# Patient Record
Sex: Female | Born: 1935 | Race: White | Hispanic: No | Smoking: Former smoker
Health system: Southern US, Community
[De-identification: ages and names within clinical notes are randomized; demographics above are authoritative.]

## PROBLEM LIST (undated history)

## (undated) DIAGNOSIS — D638 Anemia in other chronic diseases classified elsewhere: Secondary | ICD-10-CM

## (undated) DIAGNOSIS — J309 Allergic rhinitis, unspecified: Secondary | ICD-10-CM

## (undated) DIAGNOSIS — J150 Pneumonia due to Klebsiella pneumoniae: Secondary | ICD-10-CM

## (undated) DIAGNOSIS — E785 Hyperlipidemia, unspecified: Secondary | ICD-10-CM

## (undated) DIAGNOSIS — R1310 Dysphagia, unspecified: Secondary | ICD-10-CM

## (undated) DIAGNOSIS — G894 Chronic pain syndrome: Secondary | ICD-10-CM

## (undated) DIAGNOSIS — M545 Low back pain, unspecified: Secondary | ICD-10-CM

## (undated) DIAGNOSIS — F418 Other specified anxiety disorders: Secondary | ICD-10-CM

## (undated) DIAGNOSIS — Z9911 Dependence on respirator [ventilator] status: Secondary | ICD-10-CM

## (undated) DIAGNOSIS — B370 Candidal stomatitis: Secondary | ICD-10-CM

## (undated) DIAGNOSIS — E079 Disorder of thyroid, unspecified: Secondary | ICD-10-CM

## (undated) DIAGNOSIS — I480 Paroxysmal atrial fibrillation: Secondary | ICD-10-CM

## (undated) DIAGNOSIS — R739 Hyperglycemia, unspecified: Secondary | ICD-10-CM

## (undated) DIAGNOSIS — G8929 Other chronic pain: Secondary | ICD-10-CM

## (undated) DIAGNOSIS — G7281 Critical illness myopathy: Secondary | ICD-10-CM

## (undated) DIAGNOSIS — I350 Nonrheumatic aortic (valve) stenosis: Secondary | ICD-10-CM

## (undated) DIAGNOSIS — J189 Pneumonia, unspecified organism: Secondary | ICD-10-CM

## (undated) DIAGNOSIS — I10 Essential (primary) hypertension: Secondary | ICD-10-CM

## (undated) DIAGNOSIS — J9601 Acute respiratory failure with hypoxia: Secondary | ICD-10-CM

## (undated) DIAGNOSIS — E870 Hyperosmolality and hypernatremia: Secondary | ICD-10-CM

## (undated) DIAGNOSIS — I519 Heart disease, unspecified: Secondary | ICD-10-CM

## (undated) DIAGNOSIS — I739 Peripheral vascular disease, unspecified: Secondary | ICD-10-CM

## (undated) DIAGNOSIS — J9602 Acute respiratory failure with hypercapnia: Secondary | ICD-10-CM

## (undated) DIAGNOSIS — N179 Acute kidney failure, unspecified: Secondary | ICD-10-CM

## (undated) DIAGNOSIS — E43 Unspecified severe protein-calorie malnutrition: Secondary | ICD-10-CM

## (undated) DIAGNOSIS — J449 Chronic obstructive pulmonary disease, unspecified: Secondary | ICD-10-CM

---

## 2014-03-29 DIAGNOSIS — I350 Nonrheumatic aortic (valve) stenosis: Secondary | ICD-10-CM

## 2014-03-29 DIAGNOSIS — N179 Acute kidney failure, unspecified: Secondary | ICD-10-CM

## 2014-03-29 DIAGNOSIS — I480 Paroxysmal atrial fibrillation: Secondary | ICD-10-CM

## 2014-03-29 HISTORY — PX: AORTIC VALVE REPLACEMENT: SHX41

## 2014-03-29 HISTORY — DX: Acute kidney failure, unspecified: N17.9

## 2014-03-29 HISTORY — DX: Nonrheumatic aortic (valve) stenosis: I35.0

## 2014-03-29 HISTORY — DX: Paroxysmal atrial fibrillation: I48.0

## 2014-03-29 HISTORY — PX: MAZE: SHX5063

## 2014-04-29 HISTORY — PX: TRACHEOSTOMY: SUR1362

## 2014-04-29 HISTORY — PX: PEG PLACEMENT: SHX5437

## 2014-05-29 HISTORY — PX: ESOPHAGOGASTRODUODENOSCOPY: SHX1529

## 2014-07-10 ENCOUNTER — Inpatient Hospital Stay (HOSPITAL_COMMUNITY)
Admission: EM | Admit: 2014-07-10 | Discharge: 2014-07-16 | DRG: 377 | Disposition: A | Discharge status: Other Acute Inpatient Hospital | Payer: Medicare Other | Attending: Internal Medicine | Admitting: Internal Medicine

## 2014-07-10 ENCOUNTER — Encounter (HOSPITAL_COMMUNITY): Payer: Self-pay

## 2014-07-10 ENCOUNTER — Emergency Department (HOSPITAL_COMMUNITY): Payer: Medicare Other

## 2014-07-10 ENCOUNTER — Inpatient Hospital Stay (HOSPITAL_COMMUNITY): Payer: Medicare Other

## 2014-07-10 ENCOUNTER — Encounter (HOSPITAL_COMMUNITY)
Admission: EM | Disposition: A | Discharge status: Nursing Home (Includes ICF) | Payer: Self-pay | Source: Home / Self Care | Attending: Internal Medicine

## 2014-07-10 DIAGNOSIS — E039 Hypothyroidism, unspecified: Secondary | ICD-10-CM | POA: Diagnosis not present

## 2014-07-10 DIAGNOSIS — F329 Major depressive disorder, single episode, unspecified: Secondary | ICD-10-CM | POA: Diagnosis present

## 2014-07-10 DIAGNOSIS — N390 Urinary tract infection, site not specified: Secondary | ICD-10-CM | POA: Diagnosis not present

## 2014-07-10 DIAGNOSIS — I5030 Unspecified diastolic (congestive) heart failure: Secondary | ICD-10-CM | POA: Diagnosis not present

## 2014-07-10 DIAGNOSIS — N189 Chronic kidney disease, unspecified: Secondary | ICD-10-CM | POA: Diagnosis present

## 2014-07-10 DIAGNOSIS — Z952 Presence of prosthetic heart valve: Secondary | ICD-10-CM

## 2014-07-10 DIAGNOSIS — I472 Ventricular tachycardia: Secondary | ICD-10-CM | POA: Diagnosis present

## 2014-07-10 DIAGNOSIS — R1310 Dysphagia, unspecified: Secondary | ICD-10-CM | POA: Diagnosis present

## 2014-07-10 DIAGNOSIS — E785 Hyperlipidemia, unspecified: Secondary | ICD-10-CM | POA: Diagnosis present

## 2014-07-10 DIAGNOSIS — Y95 Nosocomial condition: Secondary | ICD-10-CM | POA: Diagnosis not present

## 2014-07-10 DIAGNOSIS — Z9289 Personal history of other medical treatment: Secondary | ICD-10-CM

## 2014-07-10 DIAGNOSIS — E1165 Type 2 diabetes mellitus with hyperglycemia: Secondary | ICD-10-CM | POA: Diagnosis not present

## 2014-07-10 DIAGNOSIS — I350 Nonrheumatic aortic (valve) stenosis: Secondary | ICD-10-CM | POA: Diagnosis not present

## 2014-07-10 DIAGNOSIS — J189 Pneumonia, unspecified organism: Secondary | ICD-10-CM | POA: Diagnosis not present

## 2014-07-10 DIAGNOSIS — E870 Hyperosmolality and hypernatremia: Secondary | ICD-10-CM | POA: Diagnosis not present

## 2014-07-10 DIAGNOSIS — D638 Anemia in other chronic diseases classified elsewhere: Secondary | ICD-10-CM | POA: Diagnosis present

## 2014-07-10 DIAGNOSIS — Z931 Gastrostomy status: Secondary | ICD-10-CM

## 2014-07-10 DIAGNOSIS — I959 Hypotension, unspecified: Secondary | ICD-10-CM | POA: Diagnosis not present

## 2014-07-10 DIAGNOSIS — D62 Acute posthemorrhagic anemia: Secondary | ICD-10-CM | POA: Diagnosis not present

## 2014-07-10 DIAGNOSIS — R7881 Bacteremia: Secondary | ICD-10-CM | POA: Diagnosis not present

## 2014-07-10 DIAGNOSIS — I739 Peripheral vascular disease, unspecified: Secondary | ICD-10-CM | POA: Diagnosis not present

## 2014-07-10 DIAGNOSIS — Z9911 Dependence on respirator [ventilator] status: Secondary | ICD-10-CM

## 2014-07-10 DIAGNOSIS — I129 Hypertensive chronic kidney disease with stage 1 through stage 4 chronic kidney disease, or unspecified chronic kidney disease: Secondary | ICD-10-CM | POA: Diagnosis present

## 2014-07-10 DIAGNOSIS — K922 Gastrointestinal hemorrhage, unspecified: Secondary | ICD-10-CM

## 2014-07-10 DIAGNOSIS — Z7952 Long term (current) use of systemic steroids: Secondary | ICD-10-CM

## 2014-07-10 DIAGNOSIS — I4581 Long QT syndrome: Secondary | ICD-10-CM | POA: Diagnosis present

## 2014-07-10 DIAGNOSIS — Z880 Allergy status to penicillin: Secondary | ICD-10-CM | POA: Diagnosis not present

## 2014-07-10 DIAGNOSIS — Z794 Long term (current) use of insulin: Secondary | ICD-10-CM

## 2014-07-10 DIAGNOSIS — J9621 Acute and chronic respiratory failure with hypoxia: Secondary | ICD-10-CM

## 2014-07-10 DIAGNOSIS — J449 Chronic obstructive pulmonary disease, unspecified: Secondary | ICD-10-CM | POA: Diagnosis present

## 2014-07-10 DIAGNOSIS — R633 Feeding difficulties, unspecified: Secondary | ICD-10-CM

## 2014-07-10 DIAGNOSIS — K2950 Unspecified chronic gastritis without bleeding: Secondary | ICD-10-CM | POA: Diagnosis not present

## 2014-07-10 DIAGNOSIS — Z930 Tracheostomy status: Secondary | ICD-10-CM | POA: Diagnosis not present

## 2014-07-10 DIAGNOSIS — I480 Paroxysmal atrial fibrillation: Secondary | ICD-10-CM | POA: Diagnosis not present

## 2014-07-10 DIAGNOSIS — Z7982 Long term (current) use of aspirin: Secondary | ICD-10-CM

## 2014-07-10 DIAGNOSIS — G894 Chronic pain syndrome: Secondary | ICD-10-CM | POA: Diagnosis present

## 2014-07-10 DIAGNOSIS — J9620 Acute and chronic respiratory failure, unspecified whether with hypoxia or hypercapnia: Secondary | ICD-10-CM | POA: Diagnosis not present

## 2014-07-10 DIAGNOSIS — K921 Melena: Principal | ICD-10-CM | POA: Diagnosis present

## 2014-07-10 HISTORY — DX: Disorder of thyroid, unspecified: E07.9

## 2014-07-10 HISTORY — DX: Low back pain: M54.5

## 2014-07-10 HISTORY — DX: Chronic pain syndrome: G89.4

## 2014-07-10 HISTORY — DX: Other chronic pain: G89.29

## 2014-07-10 HISTORY — PX: ESOPHAGOGASTRODUODENOSCOPY: SHX5428

## 2014-07-10 HISTORY — DX: Unspecified severe protein-calorie malnutrition: E43

## 2014-07-10 HISTORY — DX: Hyperlipidemia, unspecified: E78.5

## 2014-07-10 HISTORY — DX: Pneumonia, unspecified organism: J18.9

## 2014-07-10 HISTORY — DX: Dysphagia, unspecified: R13.10

## 2014-07-10 HISTORY — DX: Acute respiratory failure with hypoxia: J96.01

## 2014-07-10 HISTORY — DX: Heart disease, unspecified: I51.9

## 2014-07-10 HISTORY — DX: Acute respiratory failure with hypercapnia: J96.02

## 2014-07-10 HISTORY — DX: Candidal stomatitis: B37.0

## 2014-07-10 HISTORY — DX: Chronic obstructive pulmonary disease, unspecified: J44.9

## 2014-07-10 HISTORY — DX: Critical illness myopathy: G72.81

## 2014-07-10 HISTORY — DX: Nonrheumatic aortic (valve) stenosis: I35.0

## 2014-07-10 HISTORY — DX: Anemia in other chronic diseases classified elsewhere: D63.8

## 2014-07-10 HISTORY — DX: Dependence on respirator (ventilator) status: Z99.11

## 2014-07-10 HISTORY — DX: Paroxysmal atrial fibrillation: I48.0

## 2014-07-10 HISTORY — DX: Pneumonia due to Klebsiella pneumoniae: J15.0

## 2014-07-10 HISTORY — DX: Hyperglycemia, unspecified: R73.9

## 2014-07-10 HISTORY — DX: Low back pain, unspecified: M54.50

## 2014-07-10 HISTORY — DX: Peripheral vascular disease, unspecified: I73.9

## 2014-07-10 HISTORY — DX: Hyperosmolality and hypernatremia: E87.0

## 2014-07-10 HISTORY — DX: Essential (primary) hypertension: I10

## 2014-07-10 HISTORY — DX: Allergic rhinitis, unspecified: J30.9

## 2014-07-10 LAB — CBC WITH DIFFERENTIAL/PLATELET
Basophils Absolute: 0 10*3/uL (ref 0.0–0.1)
Basophils Relative: 0 % (ref 0–1)
Eosinophils Absolute: 0 10*3/uL (ref 0.0–0.7)
Eosinophils Relative: 0 % (ref 0–5)
HCT: 25.5 % — ABNORMAL LOW (ref 36.0–46.0)
Hemoglobin: 7.6 g/dL — ABNORMAL LOW (ref 12.0–15.0)
LYMPHS ABS: 0.9 10*3/uL (ref 0.7–4.0)
Lymphocytes Relative: 6 % — ABNORMAL LOW (ref 12–46)
MCH: 29.2 pg (ref 26.0–34.0)
MCHC: 29.8 g/dL — ABNORMAL LOW (ref 30.0–36.0)
MCV: 98.1 fL (ref 78.0–100.0)
Monocytes Absolute: 1.1 10*3/uL — ABNORMAL HIGH (ref 0.1–1.0)
Monocytes Relative: 8 % (ref 3–12)
NEUTROS ABS: 12.3 10*3/uL — AB (ref 1.7–7.7)
NEUTROS PCT: 86 % — AB (ref 43–77)
Platelets: 160 10*3/uL (ref 150–400)
RBC: 2.6 MIL/uL — ABNORMAL LOW (ref 3.87–5.11)
RDW: 18 % — ABNORMAL HIGH (ref 11.5–15.5)
WBC: 14.3 10*3/uL — ABNORMAL HIGH (ref 4.0–10.5)

## 2014-07-10 LAB — GLUCOSE, CAPILLARY
GLUCOSE-CAPILLARY: 53 mg/dL — AB (ref 70–99)
Glucose-Capillary: 169 mg/dL — ABNORMAL HIGH (ref 70–99)
Glucose-Capillary: 45 mg/dL — ABNORMAL LOW (ref 70–99)
Glucose-Capillary: 120 mg/dL — ABNORMAL HIGH (ref 70–99)
Glucose-Capillary: 106 mg/dL — ABNORMAL HIGH (ref 70–99)

## 2014-07-10 LAB — POC OCCULT BLOOD, ED: Fecal Occult Bld: POSITIVE — AB

## 2014-07-10 LAB — URINE MICROSCOPIC-ADD ON

## 2014-07-10 LAB — TROPONIN I
TROPONIN I: 0.06 ng/mL — AB (ref <0.031)
Troponin I: <0.03 ng/mL (ref <0.031)

## 2014-07-10 LAB — I-STAT CHEM 8, ED
BUN: 41 mg/dL — AB (ref 6–23)
CREATININE: 1 mg/dL (ref 0.50–1.10)
Calcium, Ion: 1.25 mmol/L (ref 1.13–1.30)
Chloride: 112 mEq/L (ref 96–112)
Glucose, Bld: 161 mg/dL — ABNORMAL HIGH (ref 70–99)
HCT: 21 % — ABNORMAL LOW (ref 36.0–46.0)
HEMOGLOBIN: 7.1 g/dL — AB (ref 12.0–15.0)
POTASSIUM: 5.1 mmol/L (ref 3.5–5.1)
SODIUM: 148 mmol/L — AB (ref 135–145)
TCO2: 26 mmol/L (ref 0–100)

## 2014-07-10 LAB — BASIC METABOLIC PANEL
ANION GAP: 3 — AB (ref 5–15)
BUN: 35 mg/dL — ABNORMAL HIGH (ref 6–23)
CALCIUM: 8.2 mg/dL — AB (ref 8.4–10.5)
CHLORIDE: 118 meq/L — AB (ref 96–112)
CO2: 29 mmol/L (ref 19–32)
Creatinine, Ser: 1.04 mg/dL (ref 0.50–1.10)
GFR calc Af Amer: 59 mL/min — ABNORMAL LOW (ref >90)
GFR calc non Af Amer: 50 mL/min — ABNORMAL LOW (ref >90)
Glucose, Bld: 113 mg/dL — ABNORMAL HIGH (ref 70–99)
POTASSIUM: 5.4 mmol/L — AB (ref 3.5–5.1)
SODIUM: 150 mmol/L — AB (ref 135–145)

## 2014-07-10 LAB — CBC
HCT: 27.1 % — ABNORMAL LOW (ref 36.0–46.0)
HEMOGLOBIN: 8.7 g/dL — AB (ref 12.0–15.0)
MCH: 29.9 pg (ref 26.0–34.0)
MCHC: 32.1 g/dL (ref 30.0–36.0)
MCV: 93.1 fL (ref 78.0–100.0)
PLATELETS: 151 10*3/uL (ref 150–400)
RBC: 2.91 MIL/uL — AB (ref 3.87–5.11)
RDW: 17.4 % — ABNORMAL HIGH (ref 11.5–15.5)
WBC: 17.1 10*3/uL — AB (ref 4.0–10.5)

## 2014-07-10 LAB — HEPATIC FUNCTION PANEL
ALK PHOS: 79 U/L (ref 39–117)
ALT: 24 U/L (ref 0–35)
AST: 17 U/L (ref 0–37)
Albumin: 2.4 g/dL — ABNORMAL LOW (ref 3.5–5.2)
BILIRUBIN DIRECT: 0.1 mg/dL (ref 0.0–0.3)
Indirect Bilirubin: 0.2 mg/dL — ABNORMAL LOW (ref 0.3–0.9)
Total Bilirubin: 0.3 mg/dL (ref 0.3–1.2)
Total Protein: 5.7 g/dL — ABNORMAL LOW (ref 6.0–8.3)

## 2014-07-10 LAB — URINALYSIS, ROUTINE W REFLEX MICROSCOPIC
BILIRUBIN URINE: NEGATIVE
GLUCOSE, UA: NEGATIVE mg/dL
HGB URINE DIPSTICK: NEGATIVE
Ketones, ur: NEGATIVE mg/dL
Nitrite: NEGATIVE
Protein, ur: NEGATIVE mg/dL
SPECIFIC GRAVITY, URINE: 1.019 (ref 1.005–1.030)
Urobilinogen, UA: 0.2 mg/dL (ref 0.0–1.0)
pH: 5.5 (ref 5.0–8.0)

## 2014-07-10 LAB — PROTIME-INR
INR: 1.02 (ref 0.00–1.49)
Prothrombin Time: 13.5 seconds (ref 11.6–15.2)

## 2014-07-10 LAB — ABO/RH: ABO/RH(D): O POS

## 2014-07-10 LAB — MRSA PCR SCREENING: MRSA by PCR: POSITIVE — AB

## 2014-07-10 LAB — BLOOD PRODUCT ORDER (VERBAL) VERIFICATION

## 2014-07-10 LAB — CBG MONITORING, ED: Glucose-Capillary: 208 mg/dL — ABNORMAL HIGH (ref 70–99)

## 2014-07-10 LAB — I-STAT CG4 LACTIC ACID, ED: Lactic Acid, Venous: 0.81 mmol/L (ref 0.5–2.2)

## 2014-07-10 SURGERY — EGD (ESOPHAGOGASTRODUODENOSCOPY)
Anesthesia: Moderate Sedation

## 2014-07-10 MED ORDER — CHLORHEXIDINE GLUCONATE 0.12 % MT SOLN
15.0000 mL | Freq: Two times a day (BID) | OROMUCOSAL | Status: DC
  Administered 2014-07-10 – 2014-07-16 (×14): 15 mL via OROMUCOSAL
  Filled 2014-07-10 (×14): qty 15

## 2014-07-10 MED ORDER — FENTANYL CITRATE 0.05 MG/ML IJ SOLN
INTRAMUSCULAR | Status: DC | PRN
  Administered 2014-07-10 (×2): 12.5 ug via INTRAVENOUS

## 2014-07-10 MED ORDER — CETYLPYRIDINIUM CHLORIDE 0.05 % MT LIQD
7.0000 mL | Freq: Four times a day (QID) | OROMUCOSAL | Status: DC
  Administered 2014-07-10 – 2014-07-16 (×26): 7 mL via OROMUCOSAL

## 2014-07-10 MED ORDER — DEXTROSE 50 % IV SOLN
INTRAVENOUS | Status: AC
  Filled 2014-07-10: qty 50

## 2014-07-10 MED ORDER — MIDAZOLAM HCL 5 MG/5ML IJ SOLN
INTRAMUSCULAR | Status: DC | PRN
  Administered 2014-07-10: 2 mg via INTRAVENOUS
  Administered 2014-07-10: 1 mg via INTRAVENOUS

## 2014-07-10 MED ORDER — SODIUM CHLORIDE 0.9 % IV SOLN
INTRAVENOUS | Status: DC
  Administered 2014-07-10: 10:00:00 via INTRAVENOUS

## 2014-07-10 MED ORDER — PANTOPRAZOLE SODIUM 40 MG IV SOLR
40.0000 mg | Freq: Two times a day (BID) | INTRAVENOUS | Status: DC
Start: 2014-07-13 — End: 2014-07-11

## 2014-07-10 MED ORDER — VANCOMYCIN HCL IN DEXTROSE 1-5 GM/200ML-% IV SOLN
1000.0000 mg | Freq: Once | INTRAVENOUS | Status: AC
  Administered 2014-07-10: 1000 mg via INTRAVENOUS
  Filled 2014-07-10: qty 200

## 2014-07-10 MED ORDER — SODIUM CHLORIDE 0.9 % IV SOLN
8.0000 mg/h | INTRAVENOUS | Status: DC
  Administered 2014-07-10 – 2014-07-11 (×3): 8 mg/h via INTRAVENOUS
  Filled 2014-07-10 (×6): qty 80

## 2014-07-10 MED ORDER — MUPIROCIN 2 % EX OINT
1.0000 "application " | TOPICAL_OINTMENT | Freq: Two times a day (BID) | CUTANEOUS | Status: AC
  Administered 2014-07-10 – 2014-07-14 (×10): 1 via NASAL
  Filled 2014-07-10 (×2): qty 22

## 2014-07-10 MED ORDER — GLYCOPYRROLATE 0.2 MG/ML IJ SOLN
INTRAMUSCULAR | Status: AC
  Filled 2014-07-10: qty 1

## 2014-07-10 MED ORDER — SODIUM CHLORIDE 0.9 % IV SOLN
80.0000 mg | Freq: Once | INTRAVENOUS | Status: AC
  Administered 2014-07-10: 80 mg via INTRAVENOUS
  Filled 2014-07-10: qty 80

## 2014-07-10 MED ORDER — AMIODARONE HCL 200 MG PO TABS
200.0000 mg | ORAL_TABLET | Freq: Two times a day (BID) | ORAL | Status: DC
  Administered 2014-07-10 – 2014-07-16 (×11): 200 mg
  Filled 2014-07-10 (×13): qty 1

## 2014-07-10 MED ORDER — SODIUM CHLORIDE 0.9 % IV BOLUS (SEPSIS)
1000.0000 mL | Freq: Once | INTRAVENOUS | Status: AC
  Administered 2014-07-10: 1000 mL via INTRAVENOUS

## 2014-07-10 MED ORDER — CHLORHEXIDINE GLUCONATE CLOTH 2 % EX PADS
6.0000 | MEDICATED_PAD | Freq: Every day | CUTANEOUS | Status: DC
  Administered 2014-07-10 – 2014-07-13 (×4): 6 via TOPICAL

## 2014-07-10 MED ORDER — LEVOFLOXACIN IN D5W 750 MG/150ML IV SOLN: 750.0000 mg | INTRAVENOUS | Status: DC

## 2014-07-10 MED ORDER — LEVOFLOXACIN IN D5W 750 MG/150ML IV SOLN
750.0000 mg | Freq: Once | INTRAVENOUS | Status: AC
  Administered 2014-07-10: 750 mg via INTRAVENOUS
  Filled 2014-07-10: qty 150

## 2014-07-10 MED ORDER — SODIUM CHLORIDE 0.9 % IV SOLN: 250.0000 mL | INTRAVENOUS | Status: DC | PRN

## 2014-07-10 MED ORDER — PANTOPRAZOLE SODIUM 40 MG IV SOLR
40.0000 mg | Freq: Once | INTRAVENOUS | Status: AC
  Administered 2014-07-10: 40 mg via INTRAVENOUS
  Filled 2014-07-10: qty 40

## 2014-07-10 MED ORDER — DEXTROSE 50 % IV SOLN
25.0000 mL | Freq: Once | INTRAVENOUS | Status: AC
  Administered 2014-07-10: 25 mL via INTRAVENOUS

## 2014-07-10 MED ORDER — BUTAMBEN-TETRACAINE-BENZOCAINE 2-2-14 % EX AERO
INHALATION_SPRAY | CUTANEOUS | Status: DC | PRN
  Administered 2014-07-10 (×2): 1 via TOPICAL

## 2014-07-10 MED ORDER — INSULIN ASPART 100 UNIT/ML ~~LOC~~ SOLN
2.0000 [IU] | SUBCUTANEOUS | Status: DC
  Administered 2014-07-10: 4 [IU] via SUBCUTANEOUS
  Administered 2014-07-10: 6 [IU] via SUBCUTANEOUS
  Administered 2014-07-11 (×2): 2 [IU] via SUBCUTANEOUS

## 2014-07-10 MED ORDER — VANCOMYCIN HCL IN DEXTROSE 1-5 GM/200ML-% IV SOLN: 1000.0000 mg | INTRAVENOUS | Status: DC

## 2014-07-10 MED ORDER — LEVOTHYROXINE SODIUM 100 MCG IV SOLR
100.0000 ug | Freq: Every day | INTRAVENOUS | Status: DC
  Administered 2014-07-10 – 2014-07-15 (×6): 100 ug via INTRAVENOUS
  Filled 2014-07-10 (×7): qty 5

## 2014-07-10 MED ORDER — SODIUM CHLORIDE 0.9 % IV SOLN: 10.0000 mL/h | Freq: Once | INTRAVENOUS | Status: DC

## 2014-07-10 MED ORDER — SODIUM CHLORIDE 0.9 % IV SOLN
50.0000 ug/h | INTRAVENOUS | Status: DC
  Administered 2014-07-10: 50 ug/h via INTRAVENOUS
  Filled 2014-07-10 (×4): qty 1

## 2014-07-10 MED ORDER — OCTREOTIDE LOAD VIA INFUSION
50.0000 ug | Freq: Once | INTRAVENOUS | Status: AC
  Administered 2014-07-10: 50 ug via INTRAVENOUS
  Filled 2014-07-10: qty 25

## 2014-07-10 MED ORDER — SODIUM CHLORIDE 0.9 % IJ SOLN
10.0000 mL | INTRAMUSCULAR | Status: DC | PRN
  Administered 2014-07-10: 30 mL
  Administered 2014-07-14: 20 mL
  Filled 2014-07-10 (×2): qty 40

## 2014-07-10 MED ORDER — MIDAZOLAM HCL 5 MG/ML IJ SOLN
INTRAMUSCULAR | Status: AC
  Filled 2014-07-10: qty 2

## 2014-07-10 MED ORDER — PROTAMINE SULFATE 10 MG/ML IV SOLN
40.0000 mg | Freq: Once | INTRAVENOUS | Status: AC
  Administered 2014-07-10: 40 mg via INTRAVENOUS
  Filled 2014-07-10: qty 4

## 2014-07-10 MED ORDER — FENTANYL CITRATE 0.05 MG/ML IJ SOLN
INTRAMUSCULAR | Status: AC
  Filled 2014-07-10: qty 2

## 2014-07-10 NOTE — ED Provider Notes (Addendum)
CSN: [id number]     Arrival date & time 07/10/14  0438 History   First MD Initiated Contact with Patient 07/10/14 [phone]     Chief Complaint  Patient presents with  . GI Bleeding    The patient comes from [facility] with GI bleed.  The staff there advised PTAR that she has had two diapers full of bloody stools with clots.     (Consider location/radiation/quality/duration/timing/severity/associated sxs/prior Treatment) Patient is a 80 y.o. female presenting with hematochezia. The history is provided by the EMS personnel and a relative (son). The history is limited by the condition of the patient (tracheostomy).  Rectal Bleeding Quality:  Maroon Amount:  Copious Duration:  8 hours Timing:  Constant Progression:  Unchanged Chronicity:  Recurrent Context: spontaneously   Relieved by:  Nothing Worsened by:  Nothing tried Ineffective treatments:  None tried Associated symptoms: no abdominal pain   Risk factors: anticoagulant use and NSAID use   Risk factors comment:  Aspirin and lovenox   No past medical history on file. No past surgical history on file. No family history on file. History  Substance Use Topics  . Smoking status: Not on file  . Smokeless tobacco: Not on file  . Alcohol Use: Not on file   OB History    No data available     Review of Systems  Unable to perform ROS Gastrointestinal: Positive for hematochezia and anal bleeding. Negative for abdominal pain.      Allergies  Review of patient's allergies indicates not on file.  Home Medications   Prior to Admission medications   Not on File   There were no vitals taken for this visit. Physical Exam  Constitutional: She appears well-developed and well-nourished.  HENT:  Head: Normocephalic and atraumatic.  Tachy mucus membranes  Eyes: EOM are normal. Pupils are equal, round, and reactive to light.  Neck: Normal range of motion. Neck supple.  Tracheostomy in place  Cardiovascular: Normal rate and regular  rhythm.   Pulmonary/Chest: Tachypnea noted. No respiratory distress. She has rhonchi.  Abdominal: Soft. Bowel sounds are normal. There is no tenderness. There is no rebound and no guarding.  Blood in PEG  Genitourinary: Guaiac positive stool.  Musculoskeletal: Normal range of motion. She exhibits no edema.  Neurological: She is alert.  Skin: Skin is warm. She is not diaphoretic. There is pallor.  Psychiatric: She has a normal mood and affect.    ED Course  Procedures (including critical care time) Labs Review Labs Reviewed  POC OCCULT BLOOD, ED - Abnormal; Notable for the following:    Fecal Occult Bld POSITIVE (*)    All other components within normal limits  CBC WITH DIFFERENTIAL  PROTIME-INR  HEPATIC FUNCTION PANEL  URINALYSIS, ROUTINE W REFLEX MICROSCOPIC  I-STAT CHEM 8, ED  I-STAT CG4 LACTIC ACID, ED  TYPE AND SCREEN  TYPE AND SCREEN    Imaging Review Dg Chest Portable 1 View  07/10/2014   CLINICAL DATA:  Acute onset of shortness of breath and weakness. Initial encounter.  EXAM: PORTABLE CHEST - 1 VIEW  COMPARISON:  None.  FINDINGS: The patient's tracheostomy tube is seen ending 5-6 cm above the carina. A left PICC is noted ending about the mid SVC.  Minimal bibasilar opacities likely reflect atelectasis, though mild pneumonia might have a similar appearance. Pulmonary vascularity is at the upper limits of normal. No pleural effusion or pneumothorax is seen.  The cardiomediastinal silhouette is borderline normal in size. The patient is status post median  sternotomy. An aortic valve replacement is noted. No acute osseous abnormalities are identified.  IMPRESSION: Minimal bibasilar opacities likely reflect atelectasis, though mild pneumonia might have a similar appearance.   Electronically Signed   By: Roanna Raider M.D.   On: 07/10/2014 05:14     EKG Interpretation None      MDM   Final diagnoses:  Ventilator dependence  differential diagnosis:  1. UGIB likely  secondary to ulcer on ASA and LOvenox 2. PNA on vent PCN allergic 3. GIB could be diverticular but blood in PEG argues against this 4. UTI as source of fever  Temperature was rectal and was taken prior to administration of any blood products.  Lower following administration of PRBCs   Date: 07/10/2014  Rate: 105  Rhythm: sinus tachycardia  QRS Axis: normal  Intervals: long qtc  ST/T Wave abnormalities: normal  Conduction Disutrbances:none  Narrative Interpretation: long QTC  Old EKG Reviewed: none available    MDM Reviewed: previous chart, nursing note and vitals (from [facility]) Reviewed previous: labs Interpretation: labs, ECG and x-ray Consults: pulmonary and gastrointestinal (case d/w Dr. Arlyce Dice of GI who will see patient in the unit)  Dr. Arlyce Dice or GI states to reverse all anti coagulants. Can stop octreotide unless known liver disease.  Will see patient in the ICU  Actio: Protamine given for Lovenox per pharmacy protocol and platelets 1 six pack for aspirin.    Medications  octreotide (SANDOSTATIN) 2 mcg/mL load via infusion 50 mcg (50 mcg Intravenous Given 07/10/14 0537)    And  octreotide (SANDOSTATIN) 500 mcg in sodium chloride 0.9 % 250 mL (2 mcg/mL) infusion (50 mcg/hr Intravenous New Bag/Given 07/10/14 0533)  vancomycin (VANCOCIN) IVPB 1000 mg/200 mL premix (not administered)  levofloxacin (LEVAQUIN) IVPB 750 mg (not administered)  0.9 %  sodium chloride infusion (not administered)  sodium chloride 0.9 % injection 10-40 mL (30 mLs Intracatheter Given 07/10/14 0624)  pantoprazole (PROTONIX) injection 40 mg (40 mg Intravenous Given 07/10/14 0545)  sodium chloride 0.9 % bolus 1,000 mL (1,000 mLs Intravenous New Bag/Given 07/10/14 0520)  protamine injection 40 mg (40 mg Intravenous Given 07/10/14 0536)  sodium chloride 0.9 % bolus 1,000 mL (1,000 mLs Intravenous New Bag/Given 07/10/14 0554)   Patient had blood cultures draw, given vent and CXR finding will treat for VAP/HCAP  in PCN allergic patient.     1 unit of uncrossmatched blood 1 six pack of platelets due to aspirin.   2 units of crossmatched blood  Admit to ICU  CRITICAL CARE Performed by: Jasmine Awe Total critical care time: 120 minutes Critical care time was exclusive of separately billable procedures and treating other patients. Critical care was necessary to treat or prevent imminent or life-threatening deterioration. Critical care was time spent personally by me on the following activities: development of treatment plan with patient and/or surrogate as well as nursing, discussions with consultants, evaluation of patient's response to treatment, examination of patient, obtaining history from patient or surrogate, ordering and performing treatments and interventions, ordering and review of laboratory studies, ordering and review of radiographic studies, pulse oximetry and re-evaluation of patient's condition.    Jasmine Awe, MD 07/10/14 1610  Barbaraann Avans K Dasia Guerrier-Rasch, MD 07/10/14 [phone]

## 2014-07-10 NOTE — H&P (Signed)
PULMONARY / CRITICAL CARE MEDICINE   Name: Samantha Hubbard MRN: [id number] DOB: March 06, 1935    ADMISSION DATE:  07/10/2014 CONSULTATION DATE:  07/10/2014  REFERRING MD :  ED  CHIEF COMPLAINT:  GI bleed  INITIAL PRESENTATION: 80 year old woman with history of ventilator dependent respiratory failure with trach, dysphagia s/p PEG, diastolic CHF, COPD, chronic anemia, hypothyroidism, HTN, PAF s/p MAZE, aortic stenosis s/p aortic valve replacement, CKD, presenting from [facility] to [facility] ED with GI bleed.  STUDIES:  CXR 1/12 >> minimal bibasilar opacities  SIGNIFICANT EVENTS: 1/12 >> Active GI bleed in ED, GI consulted. Transfusing with 2u pRBC. Received protamine.   HISTORY OF PRESENT ILLNESS:  81 year old woman with history of ventilator dependent respiratory failure with trach, dysphagia s/p PEG, diastolic CHF, COPD, chronic anemia, hypothyroidism, HTN, PAF s/p MAZE, aortic stenosis s/p aortic valve replacement, CKD presenting from [facility] to [facility] ED with GI bleed. Per ED notes, she had two diapers with bloody stools with clots. She denies fevers, chills, CP, SOB, N/V, abdominal pain. Per her son, she had hemetemesis and underwent EGD just before Christmas. She was found to have peptic ulcer that was clipped. No problems since then. She is on ASA  daily and Lovenox  daily.  Admitted 04/02/2014 to [facility] for symptomatic aortic stenosis. Underwent L atrial MAZE procedure for PAF and endarterectomy on 10/6 then underwent AVR. Her postoperative course was complicated by V tach requiring DCCV, right ventricular failure, renal failure requiring HD. She failed extubation and underwent tracheostomy 11/3. She also had chest tubes for pleural effusions during this course that were removed. She was transferred to [facility] 05/14/2014 for vent weaning. She developed HCAP with klebsiella and was tx'd for this. She had a PEG tube placed 05/31/2014.   PAST MEDICAL HISTORY :   has  a past medical history of Acute respiratory failure with hypoxia and hypercapnia; Ventilator dependent; Healthcare-associated pneumonia; Pneumonia, Klebsiella; Left ventricular diastolic dysfunction; Chronic pain syndrome; Critical illness myopathy; Severe protein-calorie malnutrition; Dysphagia; COPD (chronic obstructive pulmonary disease); Anemia of chronic disease; Thyroid disease; Depression; Anxiety; Hypertension; Thrush; Hypernatremia; Paroxysmal a-fib (10/15); Aortic stenosis (10/15); Electrolyte imbalance; Renal disorder; History of hemodialysis; Hyperglycemia; Encounter for wound care; Insomnia; Peripheral vascular disease; Generalized weakness; Chronic low back pain; Emphysema of lung; Hyperlipemia; and Allergic rhinitis.  has no past surgical history on file. Prior to Admission medications   Medication Sig Start Date End Date Taking? Authorizing Provider  acetaminophen (TYLENOL) 650 MG CR tablet 650 mg by PEG Tube route every 6 (six) hours as needed for pain or fever.   Yes Historical Provider, MD  acetaminophen (TYLENOL) 650 MG suppository Place 650 mg rectally every 6 (six) hours as needed for fever.   Yes Historical Provider, MD  ALPRAZolam (XANAX) 0.25 MG tablet 0.25 mg by PEG Tube route 2 (two) times daily.   Yes Historical Provider, MD  amiodarone (PACERONE) 200 MG tablet 200 mg by PEG Tube route 2 (two) times daily.   Yes Historical Provider, MD  aspirin EC 81 MG tablet 81 mg by PEG Tube route daily.   Yes Historical Provider, MD  atorvastatin (LIPITOR) 40 MG tablet 40 mg by PEG Tube route daily.   Yes Historical Provider, MD  bismuth subsalicylate (PEPTO BISMOL) 262 MG chewable tablet 524 mg by PEG Tube route at bedtime.   Yes Historical Provider, MD  budesonide (PULMICORT) 0.5 MG/2ML nebulizer solution 0.5 mg by Tracheal Tube route every 12 (twelve) hours.   Yes Historical Provider, MD  carvedilol (COREG) 3.125 MG tablet 3.125 mg by PEG Tube route 2 (two) times daily with a meal.    Yes Historical Provider, MD  chlorhexidine (PERIDEX) 0.12 % solution 15 mLs by PEG Tube route every 12 (twelve) hours.   Yes Historical Provider, MD  Cholecalciferol (VITAMIN D3) 2000 UNITS TABS 2,000 Units by PEG Tube route daily.   Yes Historical Provider, MD  enoxaparin (LOVENOX) 40 MG/0.4ML injection Inject 40 mg into the skin daily.   Yes Historical Provider, MD  guaifenesin (HUMIBID E) 400 MG TABS tablet 400 mg by PEG Tube route 2 (two) times daily.   Yes Historical Provider, MD  insulin aspart (NOVOLOG FLEXPEN) 100 UNIT/ML FlexPen Inject 1-12 Units into the skin every 6 (six) hours. 111-200=1 units 151-200=2 units 201-250=4 units 251-300=6 units 301-350=8 units 351-400=10 units 401-450=12 units or greater   Yes Historical Provider, MD  ipratropium-albuterol (DUONEB) 0.5-2.5 (3) MG/3ML SOLN Take 3 mLs by nebulization every 6 (six) hours.   Yes Historical Provider, MD  Lactobacillus Rhamnosus, GG, (CULTURELLE PO) 1 capsule by PEG Tube route 2 (two) times daily.   Yes Historical Provider, MD  lansoprazole (PREVACID SOLUTAB) 30 MG disintegrating tablet 30 mg by PEG Tube route 2 (two) times daily.   Yes Historical Provider, MD  levothyroxine (SYNTHROID, LEVOTHROID) 200 MCG tablet Take 200 mcg by mouth daily before breakfast.   Yes Historical Provider, MD  Nutritional Supplements (NUTREN 2.0 PO) 240 mLs by Pump Prime route every 6 (six) hours. 4740ml/hr   Yes Historical Provider, MD  ondansetron (ZOFRAN) 4 MG tablet 4 mg by PEG Tube route every 6 (six) hours as needed for nausea or vomiting.   Yes Historical Provider, MD  ondansetron (ZOFRAN) 4 MG/5ML solution Place 4 mg into feeding tube every 6 (six) hours as needed for nausea or vomiting.   Yes Historical Provider, MD  oxyCODONE (OXY IR/ROXICODONE) 5 MG immediate release tablet 5 mg by PEG Tube route every 6 (six) hours as needed for moderate pain or severe pain.   Yes Historical Provider, MD  predniSONE (DELTASONE) 5 MG tablet Take 5 mg by  mouth daily with breakfast.   Yes Historical Provider, MD  PROTEIN PO Give 74 mLs by tube daily.   Yes Historical Provider, MD  sertraline (ZOLOFT) 100 MG tablet 100 mg by PEG Tube route daily.   Yes Historical Provider, MD   Allergies  Allergen Reactions  . Augmentin [Amoxicillin-Pot Clavulanate] Other (See Comments)    On MAR  . Bactrim [Sulfamethoxazole-Trimethoprim] Other (See Comments)    ON  MAR  . Neo-Synephrine 12 Hour Spray [Nasal Spray] Other (See Comments)    UNKNOWN  . Sudafed [Pseudoephedrine Hcl] Other (See Comments)    ON MAR  . Zyrtec [Cetirizine] Other (See Comments)    ON MAR    FAMILY HISTORY:  has no family status information on file.  SOCIAL HISTORY:    REVIEW OF SYSTEMS:  Denies fevers, chills, cough, chest pain, shortness of breath, N/V, abdominal pain, dysuria, headache, paresthesias. +diarrhea  SUBJECTIVE: NAD, lying in bed.  VITAL SIGNS: Temp:  [100.6 F (38.1 C)] 100.6 F (38.1 C) (01/12 0527) Pulse Rate:  [106] 106 (01/12 0527) Resp:  [33] 33 (01/12 0530) BP: (88)/(50) 88/50 mmHg (01/12 0527) SpO2:  [92 %] 92 % (01/12 0527) HEMODYNAMICS:   VENTILATOR SETTINGS:   INTAKE / OUTPUT: No intake or output data in the 24 hours ending 07/10/14 0558  PHYSICAL EXAMINATION: General:  NAD Neuro:  Alert, able to mouth responses  to questions, follows commands. No gross deficits HEENT:  [place]/AT, PERRL, EOMI, trach in place Cardiovascular:  Tachycardic regular rhythm Lungs:  Bilateral course rhonchi Abdomen:  Soft, nontender, mildly distended. PEG in place with no surrounding drainage or erythema. Maroon output in PEG tube. Musculoskeletal:  No peripheral edema Skin:  No lesions  LABS:  CBC  Recent Labs Lab 07/10/14 0459 07/10/14 0516  WBC 14.3*  --   HGB 7.6* 7.1*  HCT 25.5* 21.0*  PLT 160  --    Coag's  Recent Labs Lab 07/10/14 0459  INR 1.02   BMET  Recent Labs Lab 07/10/14 0516  NA 148*  K 5.1  CL 112  BUN 41*  CREATININE  1.00  GLUCOSE 161*   Electrolytes No results for input(s): CALCIUM, MG, PHOS in the last 168 hours. Sepsis Markers  Recent Labs Lab 07/10/14 0516  LATICACIDVEN 0.81   ABG No results for input(s): PHART, PCO2ART, PO2ART in the last 168 hours. Liver Enzymes  Recent Labs Lab 07/10/14 0459  AST 17  ALT 24  ALKPHOS 79  BILITOT 0.3  ALBUMIN 2.4*   Cardiac Enzymes No results for input(s): TROPONINI, PROBNP in the last 168 hours. Glucose No results for input(s): GLUCAP in the last 168 hours.  Imaging No results found.   ASSESSMENT / PLAN:  PULMONARY Trach replaced 1/12 >> A:  Ventilator dependent respiratory failure COPD P:   Vent support Wean as tolerated VAP prevention per protocol  CARDIOVASCULAR L PICC A:  Hypotension in setting of acute GI bleed Prolonged QTc Sinus tachycardia diastolic CHF Hx HTN PAF s/p MAZE aortic stenosis s/p aortic valve replacement 03/2014 P:  Transfusion as noted below Trend troponins Telemetry Repeat EKG Hold home Lovenox  RENAL A:  Hypernatremia P:   Continue to monitor  GASTROINTESTINAL A:   GI bleed Dysphagia s/p PEG tube P:   GI consulted - will likely need EGD NPO PPI Octreotide  HEMATOLOGIC A:   Acute on chronic anemia - Hgb 7.6 to 7.1 in ED. Previously 8.1 on 07/09/2014 VTE ppx P:  2u pRBC transfusing Received protamine in ED Follow CBC Transfuse prn per ICU protocol SCDs  INFECTIOUS A:   ?HCAP - minimal bibasilar opacities P:   BCx2 1/12 Sputum 1/12  Abx: Levofloxacin, start date 1/12, day 1 Abx: Vancomycin, start date 1/12, day 1  ENDOCRINE A:   Hypothyroidism  Hx hyperglycemia P:   Cont synthroid CBG Q4hr, SSI  NEUROLOGIC A:  No acute issues P:   Continue to monitor  FAMILY  - Updates: patient and son updated  - Inter-disciplinary family meet or Palliative Care meeting due by:  07/17/2014  Griffin Basil, MD     Pulmonary and Critical Care Medicine [facility] Pager: [phone]  07/10/2014, 5:58 AM

## 2014-07-10 NOTE — Progress Notes (Signed)
CBG 53; rechecked and CBG was 45 at 1700. Hypoglycemia Protocol initiated for NPO patient. Dextrose 50% 25 ml given through IV. CBG rechecked and was 120. Will continue to monitor patient.  Domenica Failebekah Erasmus Bistline, RN 07/10/14  1730

## 2014-07-10 NOTE — ED Notes (Signed)
According to Sharin MonsPTAR, Aram BeechamCynthia is the nurse taking care of the patient at [facility].

## 2014-07-10 NOTE — ED Notes (Signed)
The patient comes from [facility] with GI bleed.  The staff there advised PTAR that she has had two diapers full of bloody stools with clots.  The patient has no complaints of pain, GCS-15.  PTAR transported the patient to the ED to be evaluated.

## 2014-07-10 NOTE — Op Note (Signed)
[facility] [address] [place], [id number]   ENDOSCOPY PROCEDURE REPORT  PATIENT: Samantha Hubbard, Samantha Hubbard  MR#: [id number] BIRTHDATE: 1935/02/25 , 79  yrs. old GENDER: female ENDOSCOPIST: Louis Meckelobert D Kaplan, MD REFERRED BY: PROCEDURE DATE:  07/10/2014 PROCEDURE:  EGD, diagnostic ASA CLASS:     Class III INDICATIONS:  melena. MEDICATIONS: Versed 3 mg IV and Fentanyl 25 mcg IV TOPICAL ANESTHETIC: Cetacaine Spray  DESCRIPTION OF PROCEDURE: After the risks benefits and alternatives of the procedure were thoroughly explained, informed consent was obtained.  The Pentax Gastroscope [id number] endoscope was introduced through the mouth and advanced to the second portion of the duodenum , Without limitations.  The instrument was slowly withdrawn as the mucosa was fully examined.    STOMACH: Mild nonerosive chronic gastritis (inflammation) was found in the gastric antrum.   A foreign body was found in the gastric body.   There were 2 free floating tubular structures in the gastric body that appeared to be small pieces of tubing. Gastrostomy site was normal with a PEG tube in place.  There was a nearby endoscopic clip.  No fresh or old blood was seen.   Except for the findings listed the EGD was otherwise normal.  Retroflexed views revealed no abnormalities.     The scope was then withdrawn from the patient and the procedure completed.  COMPLICATIONS: There were no immediate complications.  ENDOSCOPIC IMPRESSION: 1.   Chronic gastritis (inflammation) was found in the gastric antrum 2.   There were 2 free floating tubular structures in the gastric body that appeared to be small pieces of tubing.  Gastrostomy site was normal with a PEG tube in place.  There was a nearby endoscopic clip.  No fresh or old blood was seen 3.   EGD was otherwise normal  RECOMMENDATIONS: repeat endoscopy for any overt upper GI bleeding Continue PPI therapy  REPEAT EXAM:  eSigned:   Louis Meckelobert D Kaplan, MD 07/10/2014 11:58 AM    CC:  PATIENT NAME:  Samantha Hubbard, Samantha Hubbard MR#: [id number]

## 2014-07-10 NOTE — Progress Notes (Signed)
ANTIBIOTIC CONSULT NOTE - INITIAL  Pharmacy Consult for Vancomycin and Levaquin Indication: pneumonia  Allergies  Allergen Reactions  . Augmentin [Amoxicillin-Pot Clavulanate] Other (See Comments)    On MAR  . Bactrim [Sulfamethoxazole-Trimethoprim] Other (See Comments)    ON  MAR  . Neo-Synephrine 12 Hour Spray [Nasal Spray] Other (See Comments)    UNKNOWN  . Sudafed [Pseudoephedrine Hcl] Other (See Comments)    ON MAR  . Zyrtec [Cetirizine] Other (See Comments)    ON MAR    Patient Measurements: Height: 5\' 2"  (157.5 cm) IBW/kg (Calculated) : 50.1   Vital Signs: Temp: 100.6 F (38.1 C) (01/12 0527) Temp Source: Rectal (01/12 0527) BP: 84/49 mmHg (01/12 0741) Pulse Rate: 101 (01/12 0741) Intake/Output from previous day: 01/11 0701 - 01/12 0700 In: 300 [Blood:300] Out: -  Intake/Output from this shift:    Labs:  Recent Labs  07/10/14 0459 07/10/14 0516  WBC 14.3*  --   HGB 7.6* 7.1*  PLT 160  --   CREATININE  --  1.00   CrCl cannot be calculated (Unknown ideal weight.). No results for input(s): VANCOTROUGH, VANCOPEAK, VANCORANDOM, GENTTROUGH, GENTPEAK, GENTRANDOM, TOBRATROUGH, TOBRAPEAK, TOBRARND, AMIKACINPEAK, AMIKACINTROU, AMIKACIN in the last 72 hours.   Microbiology: No results found for this or any previous visit (from the past 720 hour(s)).  Medical History: Past Medical History  Diagnosis Date  . Acute respiratory failure with hypoxia and hypercapnia   . Ventilator dependent   . Healthcare-associated pneumonia   . Pneumonia, Klebsiella   . Left ventricular diastolic dysfunction   . Chronic pain syndrome   . Critical illness myopathy   . Severe protein-calorie malnutrition   . Dysphagia   . COPD (chronic obstructive pulmonary disease)   . Anemia of chronic disease   . Thyroid disease     Hypothyroidism  . Depression   . Anxiety   . Hypertension   . Thrush   . Hypernatremia   . Paroxysmal a-fib 10/15    status post Maze   . Aortic  stenosis 10/15    status post AV replacement   . Electrolyte imbalance   . Renal disorder   . History of hemodialysis   . Hyperglycemia   . Encounter for wound care   . Insomnia   . Peripheral vascular disease   . Generalized weakness   . Chronic low back pain   . Emphysema of lung   . Hyperlipemia   . Allergic rhinitis     Medications:  APAP  Xanax  Amiodarone  ASA  Lipitor  Pulmicort  Coreg  Vit D  Lovenox 40 mg SQ daily  Humibid  SSI  Duoneb  Prevacid  Synthroid  Prednisone  Zoloft    Assessment: 80 yo female with h/o VRDF/trach, possible HCAP, for empiric antibiotics.  Vancomycin 1 g IV given in ED at 0620  Goal of Therapy:  Vancomycin trough level 15-20 mcg/ml  Plan:  Vancomycin 1 g IV q48h Levaquin 750 mg IV q48h  Sari Cogan, Gary FleetGregory Vernon 07/10/2014,7:50 AM

## 2014-07-10 NOTE — Consult Note (Signed)
[facility] Consult: 8:21 AM 07/10/2014  LOS: 0 days    Referring Provider: Dr Marchelle Gearingamaswamy  Primary Care Physician:  Hillary BowROWLEY, MCKAY, MD Primary Gastroenterologist:  Gentry FitzUnassigned.      Reason for Consultation:  GI bleed   HPI: Samantha Hubbard is a 80 y.o. female.  [place] in [place], [place].  Independent/fully functional at home prior to events of last few months.  Diastolic CHF, COPD, chronic anemia, IDDM. Transfer to [facility] from [facility] this morning with hematemesis and anemia.  Hx PAF s/p 04/02/14 Maze procedure for PAF and on 10/6 underwent aortic valve replacement.  On Lovenox and 81 ASA. Post op resp failure leading to trach 11/3. Required temporary dialysis for renal failure.  [facility]  Within one week, had hematemesis and passing blood.  [facility].    V tach required DCCV IDDM.  Transferred 11/16 [facility] and then to [facility] 1/8 after inability to wean off vent.  On Bismuth, Prevacid, 5 mg prednisone.   Developed hematemesis and 2 bloody diapers at  [facility]. 2 units PRBCs given PTA. Hgb is 7.6 - 7.1.  Platelets 160.  MCV 98. Coags 13.5 and 1.0. BUN 40 with normal creatinine at 1.0.  Transfused with 3 units of blood thus far Son is able to consent for endoscopy. Has had 3 or more colonoscopies for rectal bleeding, son says it is likely due to constipation and that colonoscopies in [place] have been unremarkable.  Had not previously had UGI issues.    Past Medical History  Diagnosis Date  . Acute respiratory failure with hypoxia and hypercapnia   . Ventilator dependent   . Healthcare-associated pneumonia   . Pneumonia, Klebsiella   . Left ventricular diastolic dysfunction   . Chronic pain syndrome   . Critical illness myopathy   . Severe protein-calorie  malnutrition   . Dysphagia   . COPD (chronic obstructive pulmonary disease)   . Anemia of chronic disease   . Thyroid disease     Hypothyroidism  . Depression   . Anxiety   . Hypertension   . Thrush   . Hypernatremia   . Paroxysmal a-fib 10/15    status post Maze   . Aortic stenosis 10/15    status post AV replacement   . Electrolyte imbalance   . Renal disorder   . History of hemodialysis   . Hyperglycemia   . Encounter for wound care   . Insomnia   . Peripheral vascular disease   . Generalized weakness   . Chronic low back pain   . Emphysema of lung   . Hyperlipemia   . Allergic rhinitis     No past surgical history on file.  Prior to Admission medications   Medication Sig Start Date End Date Taking? Authorizing Provider  acetaminophen (TYLENOL) 650 MG CR tablet 650 mg by PEG Tube route every 6 (six) hours as needed for pain or fever.   Yes Historical Provider, MD  acetaminophen (TYLENOL) 650 MG suppository Place 650 mg rectally every 6 (six)  hours as needed for fever.   Yes Historical Provider, MD  ALPRAZolam (XANAX) 0.25 MG tablet 0.25 mg by PEG Tube route 2 (two) times daily.   Yes Historical Provider, MD  amiodarone (PACERONE) 200 MG tablet 200 mg by PEG Tube route 2 (two) times daily.   Yes Historical Provider, MD  aspirin EC 81 MG tablet 81 mg by PEG Tube route daily.   Yes Historical Provider, MD  atorvastatin (LIPITOR) 40 MG tablet 40 mg by PEG Tube route daily.   Yes Historical Provider, MD  bismuth subsalicylate (PEPTO BISMOL) 262 MG chewable tablet 524 mg by PEG Tube route at bedtime.   Yes Historical Provider, MD  budesonide (PULMICORT) 0.5 MG/2ML nebulizer solution 0.5 mg by Tracheal Tube route every 12 (twelve) hours.   Yes Historical Provider, MD  carvedilol (COREG) 3.125 MG tablet 3.125 mg by PEG Tube route 2 (two) times daily with a meal.   Yes Historical Provider, MD  chlorhexidine (PERIDEX) 0.12 % solution 15 mLs by PEG Tube route every 12 (twelve)  hours.   Yes Historical Provider, MD  Cholecalciferol (VITAMIN D3) 2000 UNITS TABS 2,000 Units by PEG Tube route daily.   Yes Historical Provider, MD  enoxaparin (LOVENOX) 40 MG/0.4ML injection Inject 40 mg into the skin daily.   Yes Historical Provider, MD  guaifenesin (HUMIBID E) 400 MG TABS tablet 400 mg by PEG Tube route 2 (two) times daily.   Yes Historical Provider, MD  insulin aspart (NOVOLOG FLEXPEN) 100 UNIT/ML FlexPen Inject 1-12 Units into the skin every 6 (six) hours. 111-200=1 units 151-200=2 units 201-250=4 units 251-300=6 units 301-350=8 units 351-400=10 units 401-450=12 units or greater   Yes Historical Provider, MD  ipratropium-albuterol (DUONEB) 0.5-2.5 (3) MG/3ML SOLN Take 3 mLs by nebulization every 6 (six) hours.   Yes Historical Provider, MD  Lactobacillus Rhamnosus, GG, (CULTURELLE PO) 1 capsule by PEG Tube route 2 (two) times daily.   Yes Historical Provider, MD  lansoprazole (PREVACID SOLUTAB) 30 MG disintegrating tablet 30 mg by PEG Tube route 2 (two) times daily.   Yes Historical Provider, MD  levothyroxine (SYNTHROID, LEVOTHROID) 200 MCG tablet Take 200 mcg by mouth daily before breakfast.   Yes Historical Provider, MD  Nutritional Supplements (NUTREN 2.0 PO) 240 mLs by Pump Prime route every 6 (six) hours. 42ml/hr   Yes Historical Provider, MD  ondansetron (ZOFRAN) 4 MG tablet 4 mg by PEG Tube route every 6 (six) hours as needed for nausea or vomiting.   Yes Historical Provider, MD  ondansetron (ZOFRAN) 4 MG/5ML solution Place 4 mg into feeding tube every 6 (six) hours as needed for nausea or vomiting.   Yes Historical Provider, MD  oxyCODONE (OXY IR/ROXICODONE) 5 MG immediate release tablet 5 mg by PEG Tube route every 6 (six) hours as needed for moderate pain or severe pain.   Yes Historical Provider, MD  predniSONE (DELTASONE) 5 MG tablet Take 5 mg by mouth daily with breakfast.   Yes Historical Provider, MD  PROTEIN PO Give 74 mLs by tube daily.   Yes Historical  Provider, MD  sertraline (ZOLOFT) 100 MG tablet 100 mg by PEG Tube route daily.   Yes Historical Provider, MD    Scheduled Meds: . insulin aspart  2-6 Units Subcutaneous 6 times per day  . levothyroxine  100 mcg Intravenous Daily   Infusions: . sodium chloride    . sodium chloride    . levofloxacin (LEVAQUIN) IV 750 mg (07/10/14 0701)  . [START ON 07/12/2014] levofloxacin (LEVAQUIN)  IV    . octreotide  (SANDOSTATIN)    IV infusion 50 mcg/hr (07/10/14 0533)  . [START ON 07/12/2014] vancomycin     PRN Meds: sodium chloride, sodium chloride   Allergies as of 07/10/2014 - Review Complete 07/10/2014  Allergen Reaction Noted  . Augmentin [amoxicillin-pot clavulanate] Other (See Comments) 07/10/2014  . Bactrim [sulfamethoxazole-trimethoprim] Other (See Comments) 07/10/2014  . Neo-synephrine 12 hour spray [nasal spray] Other (See Comments) 07/10/2014  . Sudafed [pseudoephedrine hcl] Other (See Comments) 07/10/2014  . Zyrtec [cetirizine] Other (See Comments) 07/10/2014    History reviewed. No pertinent family history.  History   Social History  . Marital Status: Widowed    Spouse Name: N/A    Number of Children: N/A  . Years of Education: N/A   Occupational History  . Not on file.   Social History Main Topics  . Smoking status: Not on file  . Smokeless tobacco: Not on file  . Alcohol Use: Not on file  . Drug Use: Not on file  . Sexual Activity: Not on file   Other Topics Concern  . Not on file   Social History Narrative  . No narrative on file    REVIEW OF SYSTEMS: Constitutional:  Was getting stronger ENT:  No nose bleeds Pulm:  Per HPI CV:  No palpitations, no LE edema.  GU:  No hematuria, no frequency GI:  Tends to constipation Heme:  No large hematomas or bleeding excessively   Transfusions:  Not known Neuro:  No headaches, no peripheral tingling or numbness Derm:  No itching, no rash or sores.  Endocrine:  No sweats or chills.  No polyuria or  dysuria Immunization:  Not known Travel:  None beyond local counties in last few months.    PHYSICAL EXAM: Vital signs in last 24 hours: Filed Vitals:   07/10/14 0758  BP: 94/54  Pulse: 99  Temp: 98.8 F (37.1 C)  Resp: 26   Wt Readings from Last 3 Encounters:  07/10/14 140 lb (63.504 kg)    General: pleasant, alert, comfortable.  Actually looks well, despite the medical apparatus Head:  No swelling or asymmetry  Eyes:  No icterus or pallor Ears:  Not HOPH  Nose:  No congestion or discharge Mouth:  No blood. Neck:  Trach in place.  Not tender Lungs:  Clear bil.   Heart: RRR, valve audible.   Abdomen:  Soft, active BS, PEG site not red or tender, no blood at peg site.  ND.   Rectal: deferred   Musc/Skeltl: no joint pain or swelling Extremities:  No CCE  Neurologic:  Appropriate, unable to understand pt due to trach.  Follows all commands Skin:  No sores, rash. Tattoos:  none   Psych:  Relaxed, cooperative.   Intake/Output from previous day: 01/11 0701 - 01/12 0700 In: 300 [Blood:300] Out: -  Intake/Output this shift:    LAB RESULTS:  Recent Labs  07/10/14 0459 07/10/14 0516  WBC 14.3*  --   HGB 7.6* 7.1*  HCT 25.5* 21.0*  PLT 160  --    BMET Lab Results  Component Value Date   NA 148* 07/10/2014   K 5.1 07/10/2014   CL 112 07/10/2014   GLUCOSE 161* 07/10/2014   BUN 41* 07/10/2014   CREATININE 1.00 07/10/2014   LFT  Recent Labs  07/10/14 0459  PROT 5.7*  ALBUMIN 2.4*  AST 17  ALT 24  ALKPHOS 79  BILITOT 0.3  BILIDIR 0.1  IBILI 0.2*   PT/INR  Lab Results  Component Value Date   INR 1.02 07/10/2014   Hepatitis Panel No results for input(s): HEPBSAG, HCVAB, HEPAIGM, HEPBIGM in the last 72 hours. C-Diff No components found for: CDIFF Lipase  No results found for: LIPASE  Drugs of Abuse  No results found for: LABOPIA, COCAINSCRNUR, LABBENZ, AMPHETMU, THCU, LABBARB   RADIOLOGY STUDIES: Dg Chest Port 1 View  07/10/2014   CLINICAL  DATA:  Respiratory failure, tracheostomy  EXAM: PORTABLE CHEST - 1 VIEW  COMPARISON:  07/10/2014 at 0459 hr  FINDINGS: Mild patchy bilateral lower lobe opacities, likely atelectasis, pneumonia not entirely excluded. Underlying chronic interstitial markings/emphysematous changes. No pleural effusion or pneumothorax.  The heart is mildly enlarged.  Prosthetic aortic valve.  Left arm PICC terminates in the mid SVC.  Tracheostomy in satisfactory position.  IMPRESSION: Mild patchy bilateral lower lobe opacities, likely atelectasis.   Electronically Signed   By: Charline Bills M.D.   On: 07/10/2014 07:51   Dg Chest Portable 1 View  07/10/2014   CLINICAL DATA:  Acute onset of shortness of breath and weakness. Initial encounter.  EXAM: PORTABLE CHEST - 1 VIEW  COMPARISON:  None.  FINDINGS: The patient's tracheostomy tube is seen ending 5-6 cm above the carina. A left PICC is noted ending about the mid SVC.  Minimal bibasilar opacities likely reflect atelectasis, though mild pneumonia might have a similar appearance. Pulmonary vascularity is at the upper limits of normal. No pleural effusion or pneumothorax is seen.  The cardiomediastinal silhouette is borderline normal in size. The patient is status post median sternotomy. An aortic valve replacement is noted. No acute osseous abnormalities are identified.  IMPRESSION: Minimal bibasilar opacities likely reflect atelectasis, though mild pneumonia might have a similar appearance.   Electronically Signed   By: Roanna Raider M.D.   On: 07/10/2014 05:14    ENDOSCOPIC STUDIES: EGD in 05/2014 per HPI endoclipping of ulcer  IMPRESSION:   *  Upper GI bleed.  Hematemesis Rule out recurrent ulcer bleeding in pt s/p endoclipping of ulcer before 06/22/14.  *  Acute on chronic anemia.  Due to blood loss  *  VDRF following complicated post op course after valve surgery.  On Lovenox  *  Diastolic heart failure, COPD, recent HCAP  *  S/p PEG.     PLAN:     *   Bedside EGD today.  D/w pt and her son.   Both consent to procedure.   *  Continue the PPI drip.    Jennye Moccasin  07/10/2014, 8:21 AM Pager: [phone]  GI Attending Note   Chart was reviewed and patient was examined. X-rays and lab were reviewed.    I agree with management and plans.  Patient is having acute upper GI bleeding.  Suspect this is due to active peptic disease.  Plans for EGD today.  Barbette Hair. Arlyce Dice, M.D., [facility] [phone]

## 2014-07-10 NOTE — Progress Notes (Addendum)
EGD did not demonstrate any abnormalities including fresh or old blood, mucosal lesions except for mild nonerosive gastritis.  Source for UGI bleeding was not identified.  Recommend 1.  Continue PPI therapy 2.  Repeat EGD for recurrent bleeding 3.  Resume TF

## 2014-07-10 NOTE — Care Management Note (Addendum)
    Page 1 of 1   07/13/2014     4:07:04 PM CARE MANAGEMENT NOTE 07/13/2014  Patient:  Samantha Hubbard,Samantha Hubbard   Account Number:  [id number]  Date Initiated:  07/10/2014  Documentation initiated by:  Diannia Hogenson  Subjective/Objective Assessment:   dx GI Bleed; transferred from [facility]     Anticipated DC Date:  07/16/2014   Anticipated DC Plan:  SKILLED NURSING FACILITY  In-house referral  Clinical Social Worker      DC Planning Services  CM consult      Status of service:  In process, will continue to follow Medicare Important Message given?  YES (If response is "NO", the following Medicare IM given date fields will be blank) Date Medicare IM given:  07/13/2014 Medicare IM given by:  Montzerrat Brunell  Per UR Regulation:  Reviewed for med. necessity/level of care/duration of stay  Comments:  07/13/14 1603 Katheleen Stella RN MSN BSN CCM Per liaison pt can transfer back to [facility] if bed available when pt medically ready for transfer..  07/12/14 [id number] Saroya Riccobono RN MSN BSN CCM [facility], [facility] liaison is to inform CM if pt eligible to return to [facility] or if CSW will need to initiate bed search.

## 2014-07-10 NOTE — ED Notes (Signed)
We gave a unit of platelets, unit number [id number]/ [id number] [id number].  There was no reaction to the infusion.  This unit was verified with Stephan MinisterMelanie C.,  RN

## 2014-07-11 ENCOUNTER — Encounter (HOSPITAL_COMMUNITY): Payer: Self-pay | Admitting: Gastroenterology

## 2014-07-11 DIAGNOSIS — R633 Feeding difficulties: Secondary | ICD-10-CM

## 2014-07-11 DIAGNOSIS — D62 Acute posthemorrhagic anemia: Secondary | ICD-10-CM

## 2014-07-11 LAB — BASIC METABOLIC PANEL
Anion gap: 5 (ref 5–15)
BUN: 34 mg/dL — ABNORMAL HIGH (ref 6–23)
CHLORIDE: 116 meq/L — AB (ref 96–112)
CO2: 26 mmol/L (ref 19–32)
Calcium: 8.4 mg/dL (ref 8.4–10.5)
Creatinine, Ser: 1.18 mg/dL — ABNORMAL HIGH (ref 0.50–1.10)
GFR calc Af Amer: 50 mL/min — ABNORMAL LOW (ref >90)
GFR calc non Af Amer: 43 mL/min — ABNORMAL LOW (ref >90)
GLUCOSE: 121 mg/dL — AB (ref 70–99)
POTASSIUM: 4.9 mmol/L (ref 3.5–5.1)
SODIUM: 147 mmol/L — AB (ref 135–145)

## 2014-07-11 LAB — GLUCOSE, CAPILLARY
GLUCOSE-CAPILLARY: 104 mg/dL — AB (ref 70–99)
GLUCOSE-CAPILLARY: 159 mg/dL — AB (ref 70–99)
Glucose-Capillary: 106 mg/dL — ABNORMAL HIGH (ref 70–99)
Glucose-Capillary: 119 mg/dL — ABNORMAL HIGH (ref 70–99)
Glucose-Capillary: 121 mg/dL — ABNORMAL HIGH (ref 70–99)
Glucose-Capillary: 133 mg/dL — ABNORMAL HIGH (ref 70–99)
Glucose-Capillary: 341 mg/dL — ABNORMAL HIGH (ref 70–99)

## 2014-07-11 LAB — CBC
HCT: 26.6 % — ABNORMAL LOW (ref 36.0–46.0)
HEMATOCRIT: 32.2 % — AB (ref 36.0–46.0)
Hemoglobin: 8.3 g/dL — ABNORMAL LOW (ref 12.0–15.0)
Hemoglobin: 9.9 g/dL — ABNORMAL LOW (ref 12.0–15.0)
MCH: 29.4 pg (ref 26.0–34.0)
MCH: 29.7 pg (ref 26.0–34.0)
MCHC: 31.2 g/dL (ref 30.0–36.0)
MCHC: 30.7 g/dL (ref 30.0–36.0)
MCV: 94.3 fL (ref 78.0–100.0)
MCV: 96.7 fL (ref 78.0–100.0)
PLATELETS: 142 10*3/uL — AB (ref 150–400)
Platelets: 183 10*3/uL (ref 150–400)
RBC: 2.82 MIL/uL — AB (ref 3.87–5.11)
RBC: 3.33 MIL/uL — ABNORMAL LOW (ref 3.87–5.11)
RDW: 17.6 % — AB (ref 11.5–15.5)
RDW: 18.3 % — AB (ref 11.5–15.5)
WBC: 11.4 10*3/uL — AB (ref 4.0–10.5)
WBC: 18.9 10*3/uL — ABNORMAL HIGH (ref 4.0–10.5)

## 2014-07-11 LAB — PREPARE PLATELET PHERESIS: UNIT DIVISION: 0

## 2014-07-11 MED ORDER — BUDESONIDE 0.5 MG/2ML IN SUSP
0.5000 mg | Freq: Two times a day (BID) | RESPIRATORY_TRACT | Status: DC
  Administered 2014-07-11 – 2014-07-16 (×10): 0.5 mg via RESPIRATORY_TRACT
  Filled 2014-07-11 (×12): qty 2

## 2014-07-11 MED ORDER — IPRATROPIUM-ALBUTEROL 0.5-2.5 (3) MG/3ML IN SOLN
3.0000 mL | Freq: Four times a day (QID) | RESPIRATORY_TRACT | Status: DC
Start: 2014-07-11 — End: 2014-07-11

## 2014-07-11 MED ORDER — DEXTROSE 5 % IV SOLN
2.0000 g | Freq: Two times a day (BID) | INTRAVENOUS | Status: DC
  Administered 2014-07-11 – 2014-07-13 (×4): 2 g via INTRAVENOUS
  Filled 2014-07-11 (×5): qty 2

## 2014-07-11 MED ORDER — ALBUTEROL SULFATE (2.5 MG/3ML) 0.083% IN NEBU: 2.5000 mg | INHALATION_SOLUTION | RESPIRATORY_TRACT | Status: DC | PRN

## 2014-07-11 MED ORDER — OSMOLITE 1.5 CAL PO LIQD
1000.0000 mL | ORAL | Status: DC
  Administered 2014-07-11 – 2014-07-15 (×5): 1000 mL
  Filled 2014-07-11 (×9): qty 1000

## 2014-07-11 MED ORDER — OSMOLITE 1.5 CAL PO LIQD
1000.0000 mL | ORAL | Status: DC
  Filled 2014-07-11 (×2): qty 1000

## 2014-07-11 MED ORDER — PANTOPRAZOLE SODIUM 40 MG IV SOLR
40.0000 mg | Freq: Two times a day (BID) | INTRAVENOUS | Status: DC
  Administered 2014-07-11 – 2014-07-15 (×9): 40 mg via INTRAVENOUS
  Filled 2014-07-11 (×12): qty 40

## 2014-07-11 MED ORDER — BUDESONIDE 0.5 MG/2ML IN SUSP
0.5000 mg | Freq: Two times a day (BID) | RESPIRATORY_TRACT | Status: DC
  Filled 2014-07-11: qty 2

## 2014-07-11 MED ORDER — VITAL HIGH PROTEIN PO LIQD: 1000.0000 mL | ORAL | Status: DC

## 2014-07-11 MED ORDER — IPRATROPIUM-ALBUTEROL 0.5-2.5 (3) MG/3ML IN SOLN
3.0000 mL | Freq: Four times a day (QID) | RESPIRATORY_TRACT | Status: DC
  Administered 2014-07-11 – 2014-07-16 (×21): 3 mL via RESPIRATORY_TRACT
  Filled 2014-07-11 (×20): qty 3

## 2014-07-11 NOTE — Progress Notes (Signed)
Daily Rounding Note  07/11/2014, 9:50 AM  LOS: 1 day   SUBJECTIVE:   Small smear of dark stool this AM .  No abdominal pain.  Pt is hungry.  No nausea.       OBJECTIVE:         Vital signs in last 24 hours:    Temp:  [98.5 F (36.9 C)-101.1 F (38.4 C)] 98.5 F (36.9 C) (01/13 0759) Pulse Rate:  [61-106] 88 (01/13 0800) Resp:  [14-31] 17 (01/13 0800) BP: (84-175)/(37-130) 135/65 mmHg (01/13 0800) SpO2:  [10 %-100 %] 100 % (01/13 0800) FiO2 (%):  [40 %] 40 % (01/13 0600) Weight:  [148 lb 6.4 oz (67.314 kg)] 148 lb 6.4 oz (67.314 kg) (01/13 0500) Last BM Date: 07/10/14 Filed Weights   07/10/14 0741 07/11/14 0500  Weight: 140 lb (63.504 kg) 148 lb 6.4 oz (67.314 kg)   General: looks comfortable, not acutely ill.    Heart: RRR Chest: clear bil Abdomen: soft, NT, ND.  PEG site looks healthy. Extremities: no CCE Neuro/Psych:  Pleasant, follows all commands.  "speaking" but not able to vocalize due to trach.  Moves all 4s, no gross deficits of limb strength.   Intake/Output from previous day: 01/12 0701 - 01/13 0700 In: 1757.7 [I.V.:1182.7; Blood:325; IV Piggyback:250] Out: 615 [Urine:615]  Intake/Output this shift: Total I/O In: 45 [I.V.:45] Out: 35 [Urine:35]  Lab Results:  Recent Labs  07/10/14 0459 07/10/14 0516 07/10/14 1940 07/11/14 0504  WBC 14.3*  --  17.1* 11.4*  HGB 7.6* 7.1* 8.7* 8.3*  HCT 25.5* 21.0* 27.1* 26.6*  PLT 160  --  151 142*   BMET  Recent Labs  07/10/14 0516 07/10/14 1940 07/11/14 0504  NA 148* 150* 147*  K 5.1 5.4* 4.9  CL 112 118* 116*  CO2  --  29 26  GLUCOSE 161* 113* 121*  BUN 41* 35* 34*  CREATININE 1.00 1.04 1.18*  CALCIUM  --  8.2* 8.4   LFT  Recent Labs  07/10/14 0459  PROT 5.7*  ALBUMIN 2.4*  AST 17  ALT 24  ALKPHOS 79  BILITOT 0.3  BILIDIR 0.1  IBILI 0.2*   PT/INR  Recent Labs  07/10/14 0459  LABPROT 13.5  INR 1.02   Hepatitis Panel No  results for input(s): HEPBSAG, HCVAB, HEPAIGM, HEPBIGM in the last 72 hours.  Studies/Results: Dg Chest Port 1 View  07/10/2014   CLINICAL DATA:  Respiratory failure, tracheostomy  EXAM: PORTABLE CHEST - 1 VIEW  COMPARISON:  07/10/2014 at 0459 hr  FINDINGS: Mild patchy bilateral lower lobe opacities, likely atelectasis, pneumonia not entirely excluded. Underlying chronic interstitial markings/emphysematous changes. No pleural effusion or pneumothorax.  The heart is mildly enlarged.  Prosthetic aortic valve.  Left arm PICC terminates in the mid SVC.  Tracheostomy in satisfactory position.  IMPRESSION: Mild patchy bilateral lower lobe opacities, likely atelectasis.   Electronically Signed   By: Charline BillsSriyesh  Krishnan M.D.   On: 07/10/2014 07:51   Dg Chest Portable 1 View  07/10/2014   CLINICAL DATA:  Acute onset of shortness of breath and weakness. Initial encounter.  EXAM: PORTABLE CHEST - 1 VIEW  COMPARISON:  None.  FINDINGS: The patient's tracheostomy tube is seen ending 5-6 cm above the carina. A left PICC is noted ending about the mid SVC.  Minimal bibasilar opacities likely reflect atelectasis, though mild pneumonia might have a similar appearance. Pulmonary vascularity is at the upper limits of normal. No  pleural effusion or pneumothorax is seen.  The cardiomediastinal silhouette is borderline normal in size. The patient is status post median sternotomy. An aortic valve replacement is noted. No acute osseous abnormalities are identified.  IMPRESSION: Minimal bibasilar opacities likely reflect atelectasis, though mild pneumonia might have a similar appearance.   Electronically Signed   By: Roanna Raider M.D.   On: 07/10/2014 05:14   Scheduled Meds: . sodium chloride  10 mL/hr Intravenous Once  . amiodarone  200 mg Per Tube BID  . antiseptic oral rinse  7 mL Mouth Rinse QID  . chlorhexidine  15 mL Mouth Rinse BID  . Chlorhexidine Gluconate Cloth  6 each Topical Q0600  . insulin aspart  2-6 Units  Subcutaneous 6 times per day  . [START ON 07/12/2014] levofloxacin (LEVAQUIN) IV  750 mg Intravenous Q48H  . levothyroxine  100 mcg Intravenous Daily  . mupirocin ointment  1 application Nasal BID  . [START ON 07/13/2014] pantoprazole (PROTONIX) IV  40 mg Intravenous Q12H  . [START ON 07/12/2014] vancomycin  1,000 mg Intravenous Q48H   Continuous Infusions: . sodium chloride 20 mL/hr at 07/11/14 0800  . pantoprozole (PROTONIX) infusion 8 mg/hr (07/11/14 0800)   PRN Meds:.sodium chloride, sodium chloride  ASSESMENT:   * Upper GI bleed. Hematemesis.  Hematemesis while at [facility] s/p EGD with endoclipping 07/10/14 EGD: Chronic gastritis. Normal-appearing PEG site with nearby endoscopic clip. No fresh or old blood seen. 2 free floating tubular structures appearing to be small pieces of tubing floating seen in the gastric body.  No clear source for bleeding identified.  * Acute/ABL on chronic anemia.   Received a total of 3 units packed cells within the last 2 days. 2 of these were given at [facility], one given at [facility].  Hemoglobin improved.  * VDRF following complicated post op course after valve surgery. On Lovenox  * Diastolic heart failure, COPD, recent HCAP  * S/p PEG.     PLAN   *  If has repeat her overt upper GI bleeding, would repeat endoscopy.  *  And IV Protonix drip when current bag finishes. Thereafter twice a day Protonix, ? IV versus per tube.   *  To begin tube feeding today.     Jennye Moccasin  07/11/2014, 9:50 AM Pager: [phone]  GI Attending Note  I have personally taken an interval history, reviewed the chart, and examined the patient.  I agree with the extender's note, impression and recommendations.  Barbette Hair. Arlyce Dice, MD, [facility] [phone]

## 2014-07-11 NOTE — Plan of Care (Signed)
Problem: Phase I Progression Outcomes Goal: Voiding-avoid urinary catheter unless indicated Outcome: Not Progressing Foley catheter in place     

## 2014-07-11 NOTE — Progress Notes (Signed)
PULMONARY / CRITICAL CARE MEDICINE   Name: Samantha BoronJocelyn Tew MRN: [id number] DOB: 03/10/1935    ADMISSION DATE:  07/10/2014 CONSULTATION DATE:  07/10/2014  REFERRING MD :  ED  CHIEF COMPLAINT:  GI bleed  INITIAL PRESENTATION: 80 year old woman with history of ventilator dependent respiratory failure with trach, dysphagia s/p PEG, diastolic CHF, COPD, chronic anemia, hypothyroidism, HTN, PAF s/p MAZE, aortic stenosis s/p aortic valve replacement, CKD, [facility].  STUDIES:  CXR 1/12 >> minimal bibasilar opacities  SIGNIFICANT EVENTS: 1/12 >> Active GI bleed in ED, GI consulted. Transfusing with 2u pRBC. Received protamine.  SUBJECTIVE: Tolerating ATC this am  VITAL SIGNS: Temp:  [98.5 F (36.9 C)-101.1 F (38.4 C)] 98.5 F (36.9 C) (01/13 0759) Pulse Rate:  [61-106] 93 (01/13 1000) Resp:  [14-31] 24 (01/13 1000) BP: (84-175)/(37-130) 134/60 mmHg (01/13 1000) SpO2:  [10 %-100 %] 100 % (01/13 1000) FiO2 (%):  [40 %] 40 % (01/13 0600) Weight:  [67.314 kg (148 lb 6.4 oz)] 67.314 kg (148 lb 6.4 oz) (01/13 0500) HEMODYNAMICS:   VENTILATOR SETTINGS: Vent Mode:  [-] SIMV;PSV;PRVC FiO2 (%):  [40 %] 40 % Set Rate:  [14 bmp] 14 bmp Vt Set:  [400 mL] 400 mL PEEP:  [5 cmH20] 5 cmH20 Pressure Support:  [10 cmH20] 10 cmH20 Plateau Pressure:  [17 cmH20-21 cmH20] 21 cmH20 INTAKE / OUTPUT:  Intake/Output Summary (Last 24 hours) at 07/11/14 1016 Last data filed at 07/11/14 1000  Gross per 24 hour  Intake 1342.66 ml  Output    650 ml  Net 692.66 ml    PHYSICAL EXAMINATION: General:  NAD Neuro:  Alert, able to mouth responses to questions, follows commands. No gross deficits HEENT:  [place]/AT, PERRL, EOMI, trach in place Cardiovascular:  Tachycardic regular rhythm Lungs:  Bilateral course rhonchi Abdomen:  Soft, nontender, mildly distended. PEG in place, CDI Musculoskeletal:  No peripheral edema Skin:  No lesions  LABS:  CBC  Recent  Labs Lab 07/10/14 0459 07/10/14 0516 07/10/14 1940 07/11/14 0504  WBC 14.3*  --  17.1* 11.4*  HGB 7.6* 7.1* 8.7* 8.3*  HCT 25.5* 21.0* 27.1* 26.6*  PLT 160  --  151 142*   Coag's  Recent Labs Lab 07/10/14 0459  INR 1.02   BMET  Recent Labs Lab 07/10/14 0516 07/10/14 1940 07/11/14 0504  NA 148* 150* 147*  K 5.1 5.4* 4.9  CL 112 118* 116*  CO2  --  29 26  BUN 41* 35* 34*  CREATININE 1.00 1.04 1.18*  GLUCOSE 161* 113* 121*   Electrolytes  Recent Labs Lab 07/10/14 1940 07/11/14 0504  CALCIUM 8.2* 8.4   Sepsis Markers  Recent Labs Lab 07/10/14 0516  LATICACIDVEN 0.81   ABG No results for input(s): PHART, PCO2ART, PO2ART in the last 168 hours. Liver Enzymes  Recent Labs Lab 07/10/14 0459  AST 17  ALT 24  ALKPHOS 79  BILITOT 0.3  ALBUMIN 2.4*   Cardiac Enzymes  Recent Labs Lab 07/10/14 0740 07/10/14 1340 07/10/14 1914  TROPONINI <0.03 <0.03 0.06*   Glucose  Recent Labs Lab 07/10/14 1700 07/10/14 1730 07/10/14 1921 07/10/14 2352 07/11/14 0352 07/11/14 0753  GLUCAP 45* 120* 106* 121* 119* 106*    Imaging Dg Chest Port 1 View  07/10/2014   CLINICAL DATA:  Respiratory failure, tracheostomy  EXAM: PORTABLE CHEST - 1 VIEW  COMPARISON:  07/10/2014 at 0459 hr  FINDINGS: Mild patchy bilateral lower lobe opacities, likely atelectasis, pneumonia not entirely excluded.  Underlying chronic interstitial markings/emphysematous changes. No pleural effusion or pneumothorax.  The heart is mildly enlarged.  Prosthetic aortic valve.  Left arm PICC terminates in the mid SVC.  Tracheostomy in satisfactory position.  IMPRESSION: Mild patchy bilateral lower lobe opacities, likely atelectasis.   Electronically Signed   By: Charline Bills M.D.   On: 07/10/2014 07:51   Dg Chest Portable 1 View  07/10/2014   CLINICAL DATA:  Acute onset of shortness of breath and weakness. Initial encounter.  EXAM: PORTABLE CHEST - 1 VIEW  COMPARISON:  None.  FINDINGS: The  patient's tracheostomy tube is seen ending 5-6 cm above the carina. A left PICC is noted ending about the mid SVC.  Minimal bibasilar opacities likely reflect atelectasis, though mild pneumonia might have a similar appearance. Pulmonary vascularity is at the upper limits of normal. No pleural effusion or pneumothorax is seen.  The cardiomediastinal silhouette is borderline normal in size. The patient is status post median sternotomy. An aortic valve replacement is noted. No acute osseous abnormalities are identified.  IMPRESSION: Minimal bibasilar opacities likely reflect atelectasis, though mild pneumonia might have a similar appearance.   Electronically Signed   By: Roanna Raider M.D.   On: 07/10/2014 05:14     ASSESSMENT / PLAN:  PULMONARY Trach replaced 1/12 >> A:  Ventilator dependent respiratory failure COPD P:   She apparently was using vent at night at [facility], unclear how much ATC she would tolerate. Consider extending ATC time every day to see if she can wean off vent completely. For now SIMV orders are in place for qhs VAP prevention per protocol  CARDIOVASCULAR L PICC A:  Hypotension in setting of acute GI bleed, resolved Prolonged QTc diastolic CHF Hx HTN PAF s/p MAZE aortic stenosis s/p aortic valve replacement 03/2014 P:  Transfusion as noted below Telemetry Hold home Lovenox, restart when OK with GI  RENAL A:  Hypernatremia P:   Continue to monitor  GASTROINTESTINAL A:   GI bleed Dysphagia s/p PEG tube EGD 1/12 >> gastritis, ? Foreign body, small piece of tubing in the stomach P:   NPO PPI gtt to end 1/13, then to protonix bid Octreotide d/c'd  HEMATOLOGIC A:   Acute on chronic anemia - stabilizing at ~ 8.0 VTE ppx P:  2u pRBC given Received protamine in ED Follow CBC Transfuse prn per ICU protocol SCDs  INFECTIOUS A:   ?HCAP - minimal bibasilar opacities P:   BCx2 1/12 Sputum 1/12  Abx: Levofloxacin, start date 1/12 >> 1/13 Abx:  Vancomycin, start date 1/12 >> 1/13  Doubt HCAP, will d/c abx and follow on 1/13  ENDOCRINE A:   Hypothyroidism  Hx hyperglycemia P:   Cont synthroid CBG Q4hr, SSI  NEUROLOGIC A:  No acute issues P:   Continue to monitor  FAMILY  - Updates: patient and son updated on 1/12  - Inter-disciplinary family meet or Palliative Care meeting due by:  07/17/2014  Move to SDU bed 1/13  Levy Pupa, MD, PhD 07/11/2014, 10:27 AM [facility] [phone] or if no answer [phone]

## 2014-07-11 NOTE — Progress Notes (Signed)
INITIAL NUTRITION ASSESSMENT  DOCUMENTATION CODES Per approved criteria  -Not Applicable   INTERVENTION: Initiate Osmolite 1.5 @ 10 ml/hr via PEG and increase by 10 ml every 4 hours to goal rate of 55 ml/hr.   Tube feeding regimen provides 1980 kcal (100% of needs), 83 grams of protein, and 1003 ml of H2O.   NUTRITION DIAGNOSIS: Inadequate oral intake related to inability to eat as evidenced by NPO.   Goal: Pt to meet >/= 90% of their estimated nutrition needs   Monitor:  Weight trend, TF I&M, labs, NPO  Reason for Assessment: Consult for TF I&M  80 y.o. female  Admitting Dx: <principal problem not specified>  ASSESSMENT: 80 year old woman with history of ventilator dependent respiratory failure with trach, dysphagia s/p PEG, diastolic CHF, COPD, chronic anemia, hypothyroidism, HTN, PAF s/p MAZE, aortic stenosis s/p aortic valve replacement, CKD, [facility] bleed.  - Pt was asking for something to eat. Explained to pt that the MD has her NPO.  - Currently on trach. Pt has PEG tube.  - TF regimen at [facility] is Nutren 2.0- 240 mL QID which provides 1920 kcal, 77 g protein and 662 ml of fluid.  - No signs of fat or muscle wasting  Labs: CBGs: 106-121 Na elevated K WNL BUN elevated  Height: Ht Readings from Last 1 Encounters:  07/10/14 5\' 2"  (1.575 m)    Weight: Wt Readings from Last 1 Encounters:  07/11/14 148 lb 6.4 oz (67.314 kg)    Ideal Body Weight: 50.1 kg  % Ideal Body Weight: 134%  Wt Readings from Last 10 Encounters:  07/11/14 148 lb 6.4 oz (67.314 kg)   BMI:  Body mass index is 27.14 kg/(m^2).  Estimated Nutritional Needs: Kcal: 1800-2000 Protein: 85-100 g Fluid: 1.8-2.0 L/day  Skin: incision on buttocks  Diet Order: Diet NPO time specified  EDUCATION NEEDS: -No education needs identified at this time   Intake/Output Summary (Last 24 hours) at 07/11/14 1154 Last data filed at 07/11/14 1100  Gross per  24 hour  Intake   1355 ml  Output    710 ml  Net    645 ml    Last BM: 1/12   Labs:   Recent Labs Lab 07/10/14 0516 07/10/14 1940 07/11/14 0504  NA 148* 150* 147*  K 5.1 5.4* 4.9  CL 112 118* 116*  CO2  --  29 26  BUN 41* 35* 34*  CREATININE 1.00 1.04 1.18*  CALCIUM  --  8.2* 8.4  GLUCOSE 161* 113* 121*    CBG (last 3)   Recent Labs  07/10/14 2352 07/11/14 0352 07/11/14 0753  GLUCAP 121* 119* 106*    Scheduled Meds: . sodium chloride  10 mL/hr Intravenous Once  . amiodarone  200 mg Per Tube BID  . antiseptic oral rinse  7 mL Mouth Rinse QID  . chlorhexidine  15 mL Mouth Rinse BID  . Chlorhexidine Gluconate Cloth  6 each Topical Q0600  . insulin aspart  2-6 Units Subcutaneous 6 times per day  . levothyroxine  100 mcg Intravenous Daily  . mupirocin ointment  1 application Nasal BID  . pantoprazole (PROTONIX) IV  40 mg Intravenous Q12H    Continuous Infusions: . sodium chloride 20 mL/hr at 07/11/14 1100    Past Medical History  Diagnosis Date  . Acute respiratory failure with hypoxia and hypercapnia   . Ventilator dependent   . Healthcare-associated pneumonia   . Pneumonia, Klebsiella   .  Left ventricular diastolic dysfunction   . Chronic pain syndrome   . Critical illness myopathy   . Severe protein-calorie malnutrition   . Dysphagia   . COPD (chronic obstructive pulmonary disease)   . Anemia of chronic disease   . Thyroid disease     Hypothyroidism  . Depression   . Anxiety   . Hypertension   . Thrush   . Hypernatremia   . Paroxysmal a-fib 10/15    status post Maze   . Aortic stenosis 10/15    status post AV replacement   . Electrolyte imbalance   . Renal disorder   . History of hemodialysis   . Hyperglycemia   . Encounter for wound care   . Insomnia   . Peripheral vascular disease   . Generalized weakness   . Chronic low back pain   . Emphysema of lung   . Hyperlipemia   . Allergic rhinitis     History reviewed. No pertinent  past surgical history.  Samantha Kluver MS, RD, LDN

## 2014-07-11 NOTE — Plan of Care (Signed)
Problem: Phase I Progression Outcomes Goal: Initial discharge plan identified Outcome: Completed/Met Date Met:  07/11/14 Back to [facility]

## 2014-07-11 NOTE — Progress Notes (Signed)
ANTIBIOTIC CONSULT NOTE - INITIAL  Pharmacy Consult for [facility] Indication: GNR bacteremia  Allergies  Allergen Reactions  . Zyrtec [Cetirizine] Other (See Comments) and Anaphylaxis    ON MAR  . Augmentin [Amoxicillin-Pot Clavulanate] Other (See Comments)    Lethargy On MAR  . Bactrim [Sulfamethoxazole-Trimethoprim] Other (See Comments) and Rash    ON  MAR  . Neo-Synephrine  [Oxymetazoline] Other (See Comments)    Sneezed for 3 hours after taking it (nasal route)  . Neo-Synephrine 12 Hour Spray [Nasal Spray] Other (See Comments)    UNKNOWN  . Sudafed [Pseudoephedrine Hcl] Other (See Comments)    ON MAR    Patient Measurements: Height:  (157.5 cm) Weight: 148 lb 6.4 oz (67.314 kg) IBW/kg (Calculated) : 50.1  Vital Signs: Temp: 98 F (36.7 C) (01/13 1208) Temp Source: Oral (01/13 1208) BP: 151/56 mmHg (01/13 1400) Pulse Rate: 100 (01/13 1518) Intake/Output from previous day: 01/12 0701 - 01/13 0700 In: 1757.7 [I.V.:1182.7; Blood:325; IV Piggyback:250] Out: 615 [Urine:615] Intake/Output from this shift: Total I/O In: 315 [I.V.:315] Out: 170 [Urine:170]  Labs:  Recent Labs  07/10/14 0459 07/10/14 0516 07/10/14 1940 07/11/14 0504  WBC 14.3*  --  17.1* 11.4*  HGB 7.6* 7.1* 8.7* 8.3*  PLT 160  --  151 142*  CREATININE  --  1.00 1.04 1.18*   Estimated Creatinine Clearance: 35.9 mL/min (by C-G formula based on Cr of 1.18). No results for input(s): VANCOTROUGH, VANCOPEAK, VANCORANDOM, GENTTROUGH, GENTPEAK, GENTRANDOM, TOBRATROUGH, TOBRAPEAK, TOBRARND, AMIKACINPEAK, AMIKACINTROU, AMIKACIN in the last 72 hours.   Microbiology: Recent Results (from the past 720 hour(s))  Blood culture (routine x 2)     Status: None (Preliminary result)   Collection Time: 07/10/14  4:59 AM  Result Value Ref Range Status   Specimen Description BLOOD RIGHT ANTECUBITAL  Final   Special Requests BOTTLES DRAWN AEROBIC AND ANAEROBIC 5CC  Final   Culture   Final           BLOOD  CULTURE RECEIVED NO GROWTH TO DATE CULTURE WILL BE HELD FOR 5 DAYS BEFORE ISSUING A FINAL NEGATIVE REPORT Performed at [facility]    Report Status PENDING  Incomplete  Blood culture (routine x 2)     Status: None (Preliminary result)   Collection Time: 07/10/14  5:09 AM  Result Value Ref Range Status   Specimen Description BLOOD WRIST RIGHT  Final   Special Requests BOTTLES DRAWN AEROBIC ONLY 6CC  Final   Culture   Final    GRAM NEGATIVE RODS Note: Gram Stain Report Called to,Read Back By and Verified With: NURSE JENNIFER PARRISH 07/11/14 3:25AM ORTCH Performed at [facility]    Report Status PENDING  Incomplete  Culture, respiratory (NON-Expectorated)     Status: None (Preliminary result)   Collection Time: 07/10/14  7:37 AM  Result Value Ref Range Status   Specimen Description TRACHEAL ASPIRATE  Final   Special Requests NONE  Final   Gram Stain PENDING  Incomplete   Culture   Final    Culture reincubated for better growth Performed at [facility]    Report Status PENDING  Incomplete  MRSA PCR Screening     Status: Abnormal   Collection Time: 07/10/14  9:08 AM  Result Value Ref Range Status   MRSA by PCR POSITIVE (A) NEGATIVE Final    Comment:        The GeneXpert MRSA Assay (FDA approved for NASAL specimens only), is one component of a comprehensive MRSA  colonization surveillance program. It is not intended to diagnose MRSA infection nor to guide or monitor treatment for MRSA infections. RESULT CALLED TO, READ BACK BY AND VERIFIED WITH: R. TEAGUE 11:10 07/10/14 ([email or web address])     Medical History: Past Medical History  Diagnosis Date  . Acute respiratory failure with hypoxia and hypercapnia   . Ventilator dependent   . Healthcare-associated pneumonia   . Pneumonia, Klebsiella   . Left ventricular diastolic dysfunction   . Chronic pain syndrome   . Critical illness myopathy   . Severe protein-calorie malnutrition   . Dysphagia   . COPD  (chronic obstructive pulmonary disease)   . Anemia of chronic disease   . Thyroid disease     Hypothyroidism  . Depression   . Anxiety   . Hypertension   . Thrush   . Hypernatremia   . Paroxysmal a-fib 10/15    status post Maze   . Aortic stenosis 10/15    status post AV replacement   . Electrolyte imbalance   . Renal disorder   . History of hemodialysis   . Hyperglycemia   . Encounter for wound care   . Insomnia   . Peripheral vascular disease   . Generalized weakness   . Chronic low back pain   . Emphysema of lung   . Hyperlipemia   . Allergic rhinitis     Medications:  Anti-infectives    Start     Dose/Rate Route Frequency Ordered Stop   07/12/14 0800  levofloxacin (LEVAQUIN) IVPB 750 mg  Status:  Discontinued     750 mg100 mL/hr over 90 Minutes Intravenous Every 48 hours 07/10/14 0801 07/11/14 1033   07/12/14 0600  vancomycin (VANCOCIN) IVPB 1000 mg/200 mL premix  Status:  Discontinued     1,000 mg200 mL/hr over 60 Minutes Intravenous Every 48 hours 07/10/14 0801 07/11/14 1033   07/10/14 0530  vancomycin (VANCOCIN) IVPB 1000 mg/200 mL premix     1,000 mg200 mL/hr over 60 Minutes Intravenous  Once 07/10/14 0519 07/10/14 0752   07/10/14 0530  levofloxacin (LEVAQUIN) IVPB 750 mg     750 mg100 mL/hr over 90 Minutes Intravenous  Once 07/10/14 0519 07/10/14 [id number]     Assessment: 80 year old female with history of respiratory failure with trach from [facility] with new GI bleed now with GNR in the 1/2 blood cultures to start [place]. Patient has history of augmentin allergy but this is documented as lethargy.   WBC 11.4, Tmax 101.1, SCr 1.18 (up), estimated CrCl ~ 35-40 mL/min. Microbiology history reviewed- no significant resistance in past organisms noted.   Goal of Therapy:  Clinical resolution of infection  Plan:  Fortaz 2g IV every 12 hours Follow-up renal function and clinical status Follow-up culture results and ability to narrow as able  Link SnufferJessica Raisha Brabender, PharmD,  BCPS Clinical Pharmacist [phone] 07/11/2014,3:58 PM

## 2014-07-11 NOTE — Progress Notes (Signed)
CRITICAL VALUE ALERT  Critical value received: blood culture, aerobic bottle positive for gram negative rods  Date of notification: 07/11/14  Time of notification: 2207  Critical value read back: yes  Nurse who received alert: Alfonso EllisJessie Mumin Denomme, RN  MD notified (1st page): Dr. Tyson AliasFeinstein  Time of first page: 2215  MD notified (2nd page):  Time of second page:  Responding MD: Dr. Tyson AliasFeinstein  Time MD responded: 2215

## 2014-07-12 DIAGNOSIS — A4150 Gram-negative sepsis, unspecified: Secondary | ICD-10-CM

## 2014-07-12 DIAGNOSIS — R633 Feeding difficulties, unspecified: Secondary | ICD-10-CM

## 2014-07-12 DIAGNOSIS — R7881 Bacteremia: Secondary | ICD-10-CM

## 2014-07-12 LAB — BASIC METABOLIC PANEL
ANION GAP: 15 (ref 5–15)
ANION GAP: 7 (ref 5–15)
BUN: 34 mg/dL — ABNORMAL HIGH (ref 6–23)
BUN: 27 mg/dL — ABNORMAL HIGH (ref 6–23)
CO2: 25 mmol/L (ref 19–32)
CO2: 27 mmol/L (ref 19–32)
Calcium: 9.2 mg/dL (ref 8.4–10.5)
Calcium: 9 mg/dL (ref 8.4–10.5)
Chloride: 113 mEq/L — ABNORMAL HIGH (ref 96–112)
Chloride: 117 mEq/L — ABNORMAL HIGH (ref 96–112)
Creatinine, Ser: 1.17 mg/dL — ABNORMAL HIGH (ref 0.50–1.10)
Creatinine, Ser: 1.04 mg/dL (ref 0.50–1.10)
GFR calc Af Amer: 51 mL/min — ABNORMAL LOW (ref >90)
GFR calc Af Amer: 59 mL/min — ABNORMAL LOW (ref >90)
GFR calc non Af Amer: 44 mL/min — ABNORMAL LOW (ref >90)
GFR, EST NON AFRICAN AMERICAN: 50 mL/min — AB (ref >90)
GLUCOSE: 301 mg/dL — AB (ref 70–99)
GLUCOSE: 266 mg/dL — AB (ref 70–99)
POTASSIUM: 4.1 mmol/L (ref 3.5–5.1)
Potassium: 4.3 mmol/L (ref 3.5–5.1)
SODIUM: 151 mmol/L — AB (ref 135–145)
Sodium: 153 mmol/L — ABNORMAL HIGH (ref 135–145)

## 2014-07-12 LAB — CBC
HCT: 26.5 % — ABNORMAL LOW (ref 36.0–46.0)
HCT: 28.4 % — ABNORMAL LOW (ref 36.0–46.0)
Hemoglobin: 8.2 g/dL — ABNORMAL LOW (ref 12.0–15.0)
Hemoglobin: 8.7 g/dL — ABNORMAL LOW (ref 12.0–15.0)
MCH: 29.6 pg (ref 26.0–34.0)
MCH: 29.2 pg (ref 26.0–34.0)
MCHC: 30.9 g/dL (ref 30.0–36.0)
MCHC: 30.6 g/dL (ref 30.0–36.0)
MCV: 95.7 fL (ref 78.0–100.0)
MCV: 95.3 fL (ref 78.0–100.0)
PLATELETS: 148 10*3/uL — AB (ref 150–400)
PLATELETS: 140 10*3/uL — AB (ref 150–400)
RBC: 2.77 MIL/uL — AB (ref 3.87–5.11)
RBC: 2.98 MIL/uL — ABNORMAL LOW (ref 3.87–5.11)
RDW: 17.7 % — ABNORMAL HIGH (ref 11.5–15.5)
RDW: 17.9 % — ABNORMAL HIGH (ref 11.5–15.5)
WBC: 9.4 10*3/uL (ref 4.0–10.5)
WBC: 9.4 10*3/uL (ref 4.0–10.5)

## 2014-07-12 LAB — T4, FREE: Free T4: 1.32 ng/dL (ref 0.80–1.80)

## 2014-07-12 LAB — CULTURE, RESPIRATORY

## 2014-07-12 LAB — GLUCOSE, CAPILLARY
GLUCOSE-CAPILLARY: 195 mg/dL — AB (ref 70–99)
GLUCOSE-CAPILLARY: 200 mg/dL — AB (ref 70–99)
GLUCOSE-CAPILLARY: 236 mg/dL — AB (ref 70–99)
Glucose-Capillary: 136 mg/dL — ABNORMAL HIGH (ref 70–99)
Glucose-Capillary: 220 mg/dL — ABNORMAL HIGH (ref 70–99)
Glucose-Capillary: 306 mg/dL — ABNORMAL HIGH (ref 70–99)
Glucose-Capillary: 320 mg/dL — ABNORMAL HIGH (ref 70–99)

## 2014-07-12 LAB — CULTURE, RESPIRATORY W GRAM STAIN
Culture: NORMAL
Gram Stain: NONE SEEN

## 2014-07-12 LAB — TSH: TSH: 1.161 u[IU]/mL (ref 0.350–4.500)

## 2014-07-12 MED ORDER — INSULIN ASPART 100 UNIT/ML ~~LOC~~ SOLN
0.0000 [IU] | SUBCUTANEOUS | Status: DC
Start: 2014-07-12 — End: 2014-07-13
  Administered 2014-07-12: 5 [IU] via SUBCUTANEOUS
  Administered 2014-07-12: 3 [IU] via SUBCUTANEOUS
  Administered 2014-07-12: 11 [IU] via SUBCUTANEOUS
  Administered 2014-07-12: 3 [IU] via SUBCUTANEOUS
  Administered 2014-07-12: 11 [IU] via SUBCUTANEOUS
  Administered 2014-07-13: 7 [IU] via SUBCUTANEOUS

## 2014-07-12 MED ORDER — FREE WATER
300.0000 mL | Freq: Three times a day (TID) | Status: DC
  Administered 2014-07-12 – 2014-07-14 (×7): 300 mL

## 2014-07-12 NOTE — Progress Notes (Signed)
During assessment, patient's peg tube was unable to flush, continuous tube feeds on hold.  CCM, Dr. Delton CoombesByrum notified of issue and was told to consult GI.  Dr. Arlyce DiceKaplan paged, awaiting call back.

## 2014-07-12 NOTE — Progress Notes (Signed)
          Daily Rounding Note  07/12/2014, 12:13 PM  LOS: 2 days   SUBJECTIVE:       Called back to see pt .  About 0830 this AM the RN was unable to flush the PEG tube.  Up until then the tube feeds had been running without incidence. The tube itself was dark and staff worried this might be dry blood. Pt having no pain, nausea or distress.   OBJECTIVE:         Vital signs in last 24 hours:    Temp:  [97.9 F (36.6 C)-99.7 F (37.6 C)] 98.7 F (37.1 C) (01/14 1206) Pulse Rate:  [83-111] 93 (01/14 1154) Resp:  [12-37] 30 (01/14 1154) BP: (103-153)/(50-72) 131/59 mmHg (01/14 1000) SpO2:  [97 %-100 %] 100 % (01/14 1206) FiO2 (%):  [40 %] 40 % (01/14 1206) Weight:  [134 lb 14.7 oz (61.2 kg)] 134 lb 14.7 oz (61.2 kg) (01/14 0500) Last BM Date: 07/10/14 Filed Weights   07/10/14 0741 07/11/14 0500 07/12/14 0500  Weight: 140 lb (63.504 kg) 148 lb 6.4 oz (67.314 kg) 134 lb 14.7 oz (61.2 kg)   General: alert, comfortable.  Looks well Abdomen:  PEG tube is stained dark, no blood present.  I passed an endoscope cleaning brush down the PEG and was able to clear soft material from the PEG.  After that nurse was able to flush tube with water.  There was no blood in the tube, it is just stained a dark color, this is normal.     Intake/Output from previous day: 01/13 0701 - 01/14 0700 In: 1697.4 [I.V.:700; NG/GT:897.4; IV Piggyback:100] Out: 1360 [Urine:1360]  Intake/Output this shift:    Lab Results:  Recent Labs  07/11/14 0504 07/11/14 2006 07/12/14 0415  WBC 11.4* 18.9* 9.4  HGB 8.3* 9.9* 8.2*  HCT 26.6* 32.2* 26.5*  PLT 142* 183 148*   BMET  Recent Labs  07/10/14 1940 07/11/14 0504 07/12/14 0415  NA 150* 147* 153*  K 5.4* 4.9 4.3  CL 118* 116* 113*  CO2 29 26 25   GLUCOSE 113* 121* 301*  BUN 35* 34* 34*  CREATININE 1.04 1.18* 1.17*  CALCIUM 8.2* 8.4 9.2   LFT  Recent Labs  07/10/14 0459  PROT 5.7*  ALBUMIN  2.4*  AST 17  ALT 24  ALKPHOS 79  BILITOT 0.3  BILIDIR 0.1  IBILI 0.2*   PT/INR  Recent Labs  07/10/14 0459  LABPROT 13.5  INR 1.02   Hepatitis Panel No results for input(s): HEPBSAG, HCVAB, HEPAIGM, HEPBIGM in the last 72 hours.  Studies/Results: No results found.  ASSESMENT:   *  Occluded PEG tube.  Resolved after passage of endoscopy brush.     PLAN   *  Resume tube feeding.     Jennye MoccasinSarah Gribbin  07/12/2014, 12:13 PM Pager: [phone]  GI Attending Note  I have personally taken an interval history, reviewed the chart, and examined the patient.  I agree with the extender's note, impression and recommendations.  Barbette Hairobert D. Arlyce DiceKaplan, MD, [facility] [phone]

## 2014-07-12 NOTE — Progress Notes (Signed)
PULMONARY / CRITICAL CARE MEDICINE   Name: Samantha Hubbard MRN: [id number] DOB: 02/15/1935    ADMISSION DATE:  07/10/2014 CONSULTATION DATE:  07/10/2014  REFERRING MD :  ED  CHIEF COMPLAINT:  GI bleed  INITIAL PRESENTATION: 80 year old woman with history of ventilator dependent respiratory failure with trach, dysphagia s/p PEG, diastolic CHF, COPD, chronic anemia, hypothyroidism, HTN, PAF s/p MAZE, aortic stenosis s/p aortic valve replacement, CKD, [facility].  STUDIES:  CXR 1/12 >> minimal bibasilar opacities  SIGNIFICANT EVENTS: 1/12 >> Active GI bleed in ED, GI consulted. Transfusing with 2u pRBC. Received protamine.  SUBJECTIVE: Back to SIMV overnight - tachypneic this am but comfortable.  Note positive blood cx's GNR  VITAL SIGNS: Temp:  [97.9 F (36.6 C)-99.7 F (37.6 C)] 98.3 F (36.8 C) (01/14 0800) Pulse Rate:  [83-111] 83 (01/14 0800) Resp:  [12-37] 19 (01/14 0800) BP: (103-153)/(50-72) 103/50 mmHg (01/14 0800) SpO2:  [97 %-100 %] 100 % (01/14 0820) FiO2 (%):  [40 %] 40 % (01/14 0820) Weight:  [61.2 kg (134 lb 14.7 oz)] 61.2 kg (134 lb 14.7 oz) (01/14 0500) HEMODYNAMICS:   VENTILATOR SETTINGS: Vent Mode:  [-] SIMV;PSV;PRVC FiO2 (%):  [40 %] 40 % Set Rate:  [12 bmp] 12 bmp Vt Set:  [450 mL] 450 mL PEEP:  [5 cmH20] 5 cmH20 Pressure Support:  [10 cmH20] 10 cmH20 Plateau Pressure:  [13 cmH20-21 cmH20] 13 cmH20 INTAKE / OUTPUT:  Intake/Output Summary (Last 24 hours) at 07/12/14 0842 Last data filed at 07/12/14 0600  Gross per 24 hour  Intake 1597.42 ml  Output   1325 ml  Net 272.42 ml    PHYSICAL EXAMINATION: General:  NAD Neuro:  Alert, able to mouth responses to questions, follows commands. No gross deficits HEENT:  [place]/AT, PERRL, EOMI, trach in place Cardiovascular:  Tachycardic regular rhythm Lungs:  Bilateral course rhonchi Abdomen:  Soft, nontender, mildly distended. PEG in place, CDI Musculoskeletal:   No peripheral edema Skin:  No lesions  LABS:  CBC  Recent Labs Lab 07/11/14 0504 07/11/14 2006 07/12/14 0415  WBC 11.4* 18.9* 9.4  HGB 8.3* 9.9* 8.2*  HCT 26.6* 32.2* 26.5*  PLT 142* 183 148*   Coag's  Recent Labs Lab 07/10/14 0459  INR 1.02   BMET  Recent Labs Lab 07/10/14 1940 07/11/14 0504 07/12/14 0415  NA 150* 147* 153*  K 5.4* 4.9 4.3  CL 118* 116* 113*  CO2 29 26 25   BUN 35* 34* 34*  CREATININE 1.04 1.18* 1.17*  GLUCOSE 113* 121* 301*   Electrolytes  Recent Labs Lab 07/10/14 1940 07/11/14 0504 07/12/14 0415  CALCIUM 8.2* 8.4 9.2   Sepsis Markers  Recent Labs Lab 07/10/14 0516  LATICACIDVEN 0.81   ABG No results for input(s): PHART, PCO2ART, PO2ART in the last 168 hours. Liver Enzymes  Recent Labs Lab 07/10/14 0459  AST 17  ALT 24  ALKPHOS 79  BILITOT 0.3  ALBUMIN 2.4*   Cardiac Enzymes  Recent Labs Lab 07/10/14 0740 07/10/14 1340 07/10/14 1914  TROPONINI <0.03 <0.03 0.06*   Glucose  Recent Labs Lab 07/11/14 1201 07/11/14 1553 07/11/14 1919 07/11/14 2337 07/12/14 0045 07/12/14 0346  GLUCAP 104* 133* 159* 341* 320* 306*    Imaging No results found.   ASSESSMENT / PLAN:  PULMONARY Trach replaced 1/12 >> A:  Ventilator dependent respiratory failure COPD P:   She apparently was using vent at night at [facility], unclear how much ATC she would tolerate. Consider  extending ATC time every day to see if she can wean off vent completely. For now SIMV orders are in place for qhs VAP prevention per protocol  CARDIOVASCULAR L PICC A:  Hypotension in setting of acute GI bleed, resolved Prolonged QTc diastolic CHF Hx HTN PAF s/p MAZE aortic stenosis s/p aortic valve replacement 03/2014 P:  Transfusion as noted below Telemetry Hold home Lovenox, restart when OK with GI  RENAL A:  Hypernatremia P:   Add free water  GASTROINTESTINAL A:   GI bleed Dysphagia s/p PEG tube EGD 1/12 >> gastritis, ? Foreign  body, small piece of tubing in the stomach P:   TF's running Protonix bid   HEMATOLOGIC A:   Acute on chronic anemia - Hgb 8.3 > 9.9 > 8.2 on 1/14 VTE ppx P:  Received protamine in ED Follow CBC Transfuse prn per ICU protocol SCDs  INFECTIOUS A:   GNR bacteremia, suspect GI translocation ?HCAP - minimal bibasilar opacities P:   BCx2 1/12 >> GNR >>  Sputum 1/12  Levofloxacin, start date 1/12 >> 1/13 Vancomycin, start date 1/12 >> 1/13 Ceftazidime,  start date 1/13 >>   ENDOCRINE A:   Hypothyroidism  Hx hyperglycemia P:   Cont synthroid CBG Q4hr, SSI  NEUROLOGIC A:  No acute issues P:   Continue to monitor  FAMILY  - Updates: patient and son updated on 1/12  - Inter-disciplinary family meet or Palliative Care meeting due by:  07/17/2014  Move to SDU bed 1/13, waiting for bed  Levy Pupa, MD, PhD 07/12/2014, 8:42 AM [facility] [phone] or if no answer [phone]

## 2014-07-12 NOTE — Progress Notes (Signed)
Inpatient Diabetes Program Recommendations  AACE/ADA: New Consensus Statement on Inpatient Glycemic Control (2013)  Target Ranges:  Prepandial:   less than 140 mg/dL      Peak postprandial:   less than 180 mg/dL (1-2 hours)      Critically ill patients:  140 - 180 mg/dL   Results for Samantha Hubbard, [name] (MRN [id number]) as of 07/12/2014 14:09  Ref. Range 07/11/2014 15:53 07/11/2014 19:19 07/11/2014 23:37 07/12/2014 00:45 07/12/2014 03:46 07/12/2014 08:39 07/12/2014 12:08  Glucose-Capillary Latest Range: 70-99 mg/dL 130133 (H) 865159 (H) 784341 (H) 320 (H) 306 (H) 200 (H) 136 (H)   Diabetes history: DM2 Outpatient Diabetes medications: Novolog 0-12 units Q6H Current orders for Inpatient glycemic control: Novolog 0-15 units Q4H  Inpatient Diabetes Program Recommendations Insulin - Meal Coverage: Noted tube feeding was stopped this morning since PEG tube was not able to be flushed. Noted PEG tube is now able to be flushed and plan is to restart tube feeding. CBGs were consistently elevated since tube feeding was initially started and was noted to be down to 136 mg/dl at 69:6212:08 since tube feeding has been paused since this morning. Once tube feeding is resumed, please consider ordering Novolog 4 units Q4H for tube feeding coverage (in addition to Novolog correction scale).   Thanks, Orlando PennerMarie Lynne Takemoto, RN, MSN, CCRN, CDE Diabetes Coordinator Inpatient Diabetes Program [phone] (Team Pager) [phone] (AP office) [phone] [facility] office)

## 2014-07-12 NOTE — Clinical Documentation Improvement (Signed)
Possible Clinical Conditions?    Expected Acute Blood Loss Anemia  Acute Blood Loss Anemia  Acute on chronic blood loss anemia  Chronic blood loss anemia  Other Condition  Cannot Clinically Determine  Supporting Information:  (As per notes)   GI BLEED  Component     Latest Ref Rng 07/10/2014 07/10/2014 07/10/2014 07/11/2014 07/11/2014         4:59 AM  5:16 AM  7:40 PM  5:04 AM  8:06 PM  WBC     4.0 - 10.5 K/uL 14.3 (H)  17.1 (H) 11.4 (H) 18.9 (H)  RBC     3.87 - 5.11 MIL/uL 2.60 (L)  2.91 (L) 2.82 (L) 3.33 (L)  Hemoglobin     12.0 - 15.0 g/dL 7.6 (L) 7.1 (L) 8.7 (L) 8.3 (L) 9.9 (L)  HCT     36.0 - 46.0 % 25.5 (L) 21.0 (L) 27.1 (L) 26.6 (L) 32.2 (L)  MCV     78.0 - 100.0 fL 98.1  93.1 94.3 96.7  MCH     26.0 - 34.0 pg 29.2  29.9 29.4 29.7  MCHC     30.0 - 36.0 g/dL 16.129.8 (L)  09.632.1 04.531.2 40.930.7  RDW     11.5 - 15.5 % 18.0 (H)  17.4 (H) 17.6 (H) 18.3 (H)  Platelets     150 - 400 K/uL 160  151 142 (L) 183   Component     Latest Ref Rng 07/12/2014          WBC     4.0 - 10.5 K/uL 9.4  RBC     3.87 - 5.11 MIL/uL 2.77 (L)  Hemoglobin     12.0 - 15.0 g/dL 8.2 (L)  HCT     81.136.0 - 46.0 % 26.5 (L)  MCV     78.0 - 100.0 fL 95.7  MCH     26.0 - 34.0 pg 29.6  MCHC     30.0 - 36.0 g/dL 91.430.9  RDW     78.211.5 - 95.615.5 % 17.7 (H)  Platelets     150 - 400 K/uL 148 (L)    Transfusion  Thank You, Nevin BloodgoodJoan B Ivar Domangue, RN, BSN, CCDS,Clinical Documentation Specialist:  [phone]  [phone]=[facility]- Health Information Management

## 2014-07-12 NOTE — Progress Notes (Signed)
Peg tube still unable to be flushed, dark drainage in tube, no call back from Dr. Arlyce DiceKaplan yet, so paged Jennye MoccasinSarah Gribbin, who returned call, and said she would round on patient as soon as possible; no orders for nursing at this time.

## 2014-07-12 NOTE — Progress Notes (Signed)
eLink Physician-Brief Progress Note Patient Name: Samantha BoronJocelyn Hubbard DOB: 09/04/1934 MRN: [id number]   Date of Service  07/12/2014  HPI/Events of Note  Hyperglycemia Normal renal function  eICU Interventions  SSI, regular scale     Intervention Category Intermediate Interventions: Hyperglycemia - evaluation and treatment  MCQUAID, DOUGLAS 07/12/2014, 1:02 AM

## 2014-07-12 NOTE — Trach Care Team (Signed)
Trach Care Progression Note   Patient Details Name: Suzanne BoronJocelyn Stoffer MRN: [id number] DOB: 10/18/1934 Today's Date: 07/12/2014   Tracheostomy Assessment    Tracheostomy Shiley 6 mm Cuffed (Active)  Status Secured 07/12/2014  4:23 PM  Site Assessment Clean;Dry 07/12/2014  4:23 PM  Site Care Cleansed;Dressing applied 07/12/2014  6:00 AM  Inner Cannula Care Changed/new 07/12/2014  6:00 AM  Ties Assessment Secure 07/12/2014  4:23 PM  Cuff pressure (cm) 24 cm 07/12/2014  8:00 AM  Trach Changed Yes 07/10/2014  7:41 AM  Emergency Equipment at bedside Yes 07/12/2014  4:23 PM     Care Needs     Respiratory Therapy O2 Device: Tracheostomy Collar FiO2 (%): 40 % SpO2: 100 % Education:  (none needed) Follow up recommendations:  (pt has chronic trach- will follow as needed)    Speech Language Pathology  SLP chart review complete: Patient not ready for SLP services (vent, trach collar during the day? using PMSV?)   Physical Therapy      Occupational Therapy      Nutritional Patient's Current Diet: NPO Tube Feeding: Osmolite 1.5 Cal Tube Feeding Frequency: Continuous Tube Feeding Strength: Full strength    Case Management/Social Work      Theatre managerrovider Trach Care Team/Provider Recommendations                                       Trach Care Team Members Present-  Cherylin MylarLauren Doyle, RT & Joaquin CourtsKimberly Harris, RD        None. Chronic trach from [facility].          Wonder Donaway, Silva BandyDebra Anita ((scribe for team) 07/12/2014, 5:06 PM

## 2014-07-13 LAB — GLUCOSE, CAPILLARY
GLUCOSE-CAPILLARY: 175 mg/dL — AB (ref 70–99)
GLUCOSE-CAPILLARY: 228 mg/dL — AB (ref 70–99)
GLUCOSE-CAPILLARY: 137 mg/dL — AB (ref 70–99)
Glucose-Capillary: 147 mg/dL — ABNORMAL HIGH (ref 70–99)
Glucose-Capillary: 244 mg/dL — ABNORMAL HIGH (ref 70–99)

## 2014-07-13 LAB — CBC
HCT: 24.9 % — ABNORMAL LOW (ref 36.0–46.0)
HEMATOCRIT: 25.1 % — AB (ref 36.0–46.0)
HEMATOCRIT: 25 % — AB (ref 36.0–46.0)
HEMOGLOBIN: 7.8 g/dL — AB (ref 12.0–15.0)
Hemoglobin: 8.1 g/dL — ABNORMAL LOW (ref 12.0–15.0)
Hemoglobin: 7.9 g/dL — ABNORMAL LOW (ref 12.0–15.0)
MCH: 31 pg (ref 26.0–34.0)
MCH: 30.2 pg (ref 26.0–34.0)
MCH: 30.5 pg (ref 26.0–34.0)
MCHC: 32.3 g/dL (ref 30.0–36.0)
MCHC: 31.3 g/dL (ref 30.0–36.0)
MCHC: 31.6 g/dL (ref 30.0–36.0)
MCV: 96.2 fL (ref 78.0–100.0)
MCV: 96.5 fL (ref 78.0–100.0)
MCV: 96.5 fL (ref 78.0–100.0)
PLATELETS: 124 10*3/uL — AB (ref 150–400)
Platelets: 133 10*3/uL — ABNORMAL LOW (ref 150–400)
Platelets: 125 10*3/uL — ABNORMAL LOW (ref 150–400)
RBC: 2.61 MIL/uL — ABNORMAL LOW (ref 3.87–5.11)
RBC: 2.58 MIL/uL — AB (ref 3.87–5.11)
RBC: 2.59 MIL/uL — AB (ref 3.87–5.11)
RDW: 17.7 % — AB (ref 11.5–15.5)
RDW: 17.6 % — ABNORMAL HIGH (ref 11.5–15.5)
RDW: 17.5 % — ABNORMAL HIGH (ref 11.5–15.5)
WBC: 6.9 10*3/uL (ref 4.0–10.5)
WBC: 6.8 10*3/uL (ref 4.0–10.5)
WBC: 6.7 10*3/uL (ref 4.0–10.5)

## 2014-07-13 LAB — BASIC METABOLIC PANEL
Anion gap: 6 (ref 5–15)
BUN: 23 mg/dL (ref 6–23)
CHLORIDE: 115 meq/L — AB (ref 96–112)
CO2: 28 mmol/L (ref 19–32)
Calcium: 8.6 mg/dL (ref 8.4–10.5)
Creatinine, Ser: 0.89 mg/dL (ref 0.50–1.10)
GFR, EST AFRICAN AMERICAN: 71 mL/min — AB (ref >90)
GFR, EST NON AFRICAN AMERICAN: 61 mL/min — AB (ref >90)
Glucose, Bld: 193 mg/dL — ABNORMAL HIGH (ref 70–99)
POTASSIUM: 3.6 mmol/L (ref 3.5–5.1)
Sodium: 149 mmol/L — ABNORMAL HIGH (ref 135–145)

## 2014-07-13 LAB — PROTIME-INR
INR: 1.07 (ref 0.00–1.49)
PROTHROMBIN TIME: 14.1 s (ref 11.6–15.2)

## 2014-07-13 LAB — CULTURE, BLOOD (ROUTINE X 2)

## 2014-07-13 LAB — APTT: APTT: 32 s (ref 24–37)

## 2014-07-13 MED ORDER — CIPROFLOXACIN IN D5W 400 MG/200ML IV SOLN
400.0000 mg | Freq: Two times a day (BID) | INTRAVENOUS | Status: DC
Start: 2014-07-13 — End: 2014-07-17
  Administered 2014-07-13 – 2014-07-16 (×7): 400 mg via INTRAVENOUS
  Filled 2014-07-13 (×8): qty 200

## 2014-07-13 MED ORDER — INSULIN ASPART 100 UNIT/ML ~~LOC~~ SOLN
0.0000 [IU] | SUBCUTANEOUS | Status: DC
  Administered 2014-07-13: 3 [IU] via SUBCUTANEOUS
  Administered 2014-07-13 (×3): 7 [IU] via SUBCUTANEOUS
  Administered 2014-07-13 – 2014-07-14 (×3): 3 [IU] via SUBCUTANEOUS
  Administered 2014-07-14 (×3): 4 [IU] via SUBCUTANEOUS
  Administered 2014-07-14: 11 [IU] via SUBCUTANEOUS
  Administered 2014-07-15 (×7): 4 [IU] via SUBCUTANEOUS
  Administered 2014-07-16: 7 [IU] via SUBCUTANEOUS
  Administered 2014-07-16: 3 [IU] via SUBCUTANEOUS
  Administered 2014-07-16 (×2): 4 [IU] via SUBCUTANEOUS

## 2014-07-13 NOTE — Progress Notes (Signed)
Dr. Kendrick FriesMcQuaid notified of hyperglycemia. Insulin scale changed. 7 units insulin given per new scale.

## 2014-07-13 NOTE — Evaluation (Signed)
Physical Therapy Evaluation Patient Details Name: Samantha BoronJocelyn Hubbard MRN: [id number] DOB: 08/01/1934 Today's Date: 07/13/2014   History of Present Illness  80 year old woman with history of ventilator dependent respiratory failure with trach, dysphagia s/p PEG, diastolic CHF, COPD, chronic anemia, hypothyroidism, HTN, PAF s/p MAZE, aortic stenosis s/p aortic valve replacement, CKD, [facility] with [facility] bleed.  Clinical Impression  Pt very pleasant and stating that she was home alone and independent before becoming ill in Nov. Pt has not been receiving therapy and is deconditioned from prolonged illness and bedrest. Pt very motivated to return to walking, being out of bed and needlepoint. Pt able to stand with assist briefly today! Pt will benefit from continued therapy to maximize strength, mobility, activity tolerance and function to decrease burden of care and improve quality of life.     Follow Up Recommendations LTACH;Supervision/Assistance - 24 hour    Equipment Recommendations  Wheelchair (measurements PT);Wheelchair cushion (measurements PT)    Recommendations for Other Services OT consult;Speech consult     Precautions / Restrictions Precautions Precautions: Fall Precaution Comments: trach, vent, PEG      Mobility  Bed Mobility Overal bed mobility: Needs Assistance Bed Mobility: Rolling;Sidelying to Sit;Supine to Sit Rolling: Min assist Sidelying to sit: Mod assist Supine to sit: Mod assist     General bed mobility comments: cues for sequence with assist to elevate trunk and increased time EOB secondary to dizziness but no hypotension. EOB grossly 10 min. Rolling bil x 2 for pericare secondary to incontinent of BM end of session  Transfers Overall transfer level: Needs assistance   Transfers: Sit to/from Stand Sit to Stand: Max assist         General transfer comment: Pt able to stand grossly 4 sec with assist at pelvis to elevate from  surface but returned to sitting due to BM and fatigue  Ambulation/Gait                Stairs            Wheelchair Mobility    Modified Rankin (Stroke Patients Only)       Balance Overall balance assessment: Needs assistance   Sitting balance-Leahy Scale: Poor Sitting balance - Comments: pt propping on forearm with flexed trunk and anterior lean Postural control: Left lateral lean   Standing balance-Leahy Scale: Zero                               Pertinent Vitals/Pain Pain Assessment: 0-10 Pain Score: 3  Pain Location: chronic back pain Pain Descriptors / Indicators: Aching Pain Intervention(s): Repositioned    Home Living Family/patient expects to be discharged to:: Other (Comment) (LTACH)                      Prior Function Level of Independence: Needs assistance   Gait / Transfers Assistance Needed: pt has not been OOB other than 3x/wk lift to chair. Has received no therapy per pt and son [facility]. Prior was independent and living alone  ADL's / Homemaking Assistance Needed: max assist for all ADLs, PEG for nutrition        Hand Dominance        Extremity/Trunk Assessment   Upper Extremity Assessment: Generalized weakness           Lower Extremity Assessment: RLE deficits/detail;LLE deficits/detail RLE Deficits / Details: hip flexion 2+/5,  knee extension 3/5, knee flexion 3/5 dorsiflexion 3/5 LLE Deficits / Details: hip flexion 2+/5, knee extension 3/5, knee flexion 3/5 dorsiflexion 3/5  Cervical / Trunk Assessment: Kyphotic  Communication   Communication: Tracheostomy (pt mouthing words and talking around trach)  Cognition Arousal/Alertness: Awake/alert Behavior During Therapy: WFL for tasks assessed/performed Overall Cognitive Status: Within Functional Limits for tasks assessed                      General Comments      Exercises General Exercises - Lower Extremity Ankle  Circles/Pumps: AROM;Both;10 reps;Supine Short Arc Quad: AROM;Seated;Both;5 reps Heel Slides: AROM;Both;10 reps;Supine Hip ABduction/ADduction: AROM;Both;10 reps;Supine      Assessment/Plan    PT Assessment Patient needs continued PT services  PT Diagnosis Generalized weakness;Difficulty walking   PT Problem List Decreased strength;Decreased activity tolerance;Decreased knowledge of use of DME;Decreased balance;Decreased mobility;Cardiopulmonary status limiting activity  PT Treatment Interventions Gait training;Functional mobility training;Therapeutic activities;Therapeutic exercise;Balance training;Patient/family education;Neuromuscular re-education   PT Goals (Current goals can be found in the Care Plan section) Acute Rehab PT Goals Patient Stated Goal: walk PT Goal Formulation: With patient Time For Goal Achievement: 07/27/14 Potential to Achieve Goals: Fair    Frequency Min 3X/week   Barriers to discharge        Co-evaluation               End of Session   Activity Tolerance: Patient limited by fatigue Patient left: in bed;with call bell/phone within reach;with nursing/sitter in room Nurse Communication: Mobility status;Precautions;Need for lift equipment         Time: 1345-1428 PT Time Calculation (min) (ACUTE ONLY): 43 min   Charges:   PT Evaluation $Initial PT Evaluation Tier I: 1 Procedure PT Treatments $Therapeutic Activity: 38-52 mins   PT G Codes:        Delorse Lek 07/13/2014, 2:38 PM Delaney Meigs, PT [phone]

## 2014-07-13 NOTE — Progress Notes (Signed)
PULMONARY / CRITICAL CARE MEDICINE   Name: Suzanne BoronJocelyn Benedict MRN: [id number] DOB: 12/11/1934    ADMISSION DATE:  07/10/2014 CONSULTATION DATE:  07/10/2014  REFERRING MD :  ED  CHIEF COMPLAINT:  GI bleed  INITIAL PRESENTATION: 80 year old woman with history of ventilator dependent respiratory failure with trach, dysphagia s/p PEG, diastolic CHF, COPD, chronic anemia, hypothyroidism, HTN, PAF s/p MAZE, aortic stenosis s/p aortic valve replacement, CKD, [facility].  STUDIES:  CXR 1/12 >> minimal bibasilar opacities  SIGNIFICANT EVENTS: 1/12 >> Active GI bleed in ED, GI consulted. Transfusing with 2u pRBC. Received protamine.  SUBJECTIVE: Doing vent qhs, ATC during the day Hemodynamically stable Hgb > 8; still w small amt blood in stool   VITAL SIGNS: Temp:  [98 F (36.7 C)-99.1 F (37.3 C)] 98.1 F (36.7 C) (01/15 0800) Pulse Rate:  [93-116] 97 (01/15 0800) Resp:  [21-36] 29 (01/15 0800) BP: (116-155)/(52-90) 139/62 mmHg (01/15 0800) SpO2:  [100 %] 100 % (01/15 0800) FiO2 (%):  [40 %] 40 % (01/15 0800) Weight:  [60.782 kg (134 lb)] 60.782 kg (134 lb) (01/15 0415) HEMODYNAMICS:   VENTILATOR SETTINGS: Vent Mode:  [-] SIMV;PRVC FiO2 (%):  [40 %] 40 % Set Rate:  [12 bmp] 12 bmp Vt Set:  [450 mL] 450 mL PEEP:  [5 cmH20] 5 cmH20 Pressure Support:  [10 cmH20] 10 cmH20 Plateau Pressure:  [19 cmH20-23 cmH20] 23 cmH20 INTAKE / OUTPUT:  Intake/Output Summary (Last 24 hours) at 07/13/14 0943 Last data filed at 07/13/14 0800  Gross per 24 hour  Intake   2160 ml  Output   1705 ml  Net    455 ml    PHYSICAL EXAMINATION: General:  NAD Neuro:  Alert, able to mouth responses to questions, follows commands. No gross deficits HEENT:  [place]/AT, PERRL, EOMI, trach in place Cardiovascular:  Tachycardic regular rhythm Lungs:  Bilateral course rhonchi Abdomen:  Soft, nontender, mildly distended. PEG in place, CDI Musculoskeletal:  No peripheral  edema Skin:  No lesions  LABS:  CBC  Recent Labs Lab 07/12/14 0415 07/12/14 2000 07/13/14 0415  WBC 9.4 9.4 6.9  HGB 8.2* 8.7* 8.1*  HCT 26.5* 28.4* 25.1*  PLT 148* 140* 133*   Coag's  Recent Labs Lab 07/10/14 0459  INR 1.02   BMET  Recent Labs Lab 07/12/14 0415 07/12/14 2000 07/13/14 0415  NA 153* 151* 149*  K 4.3 4.1 3.6  CL 113* 117* 115*  CO2 25 27 28   BUN 34* 27* 23  CREATININE 1.17* 1.04 0.89  GLUCOSE 301* 266* 193*   Electrolytes  Recent Labs Lab 07/12/14 0415 07/12/14 2000 07/13/14 0415  CALCIUM 9.2 9.0 8.6   Sepsis Markers  Recent Labs Lab 07/10/14 0516  LATICACIDVEN 0.81   ABG No results for input(s): PHART, PCO2ART, PO2ART in the last 168 hours. Liver Enzymes  Recent Labs Lab 07/10/14 0459  AST 17  ALT 24  ALKPHOS 79  BILITOT 0.3  ALBUMIN 2.4*   Cardiac Enzymes  Recent Labs Lab 07/10/14 0740 07/10/14 1340 07/10/14 1914  TROPONINI <0.03 <0.03 0.06*   Glucose  Recent Labs Lab 07/12/14 1208 07/12/14 1551 07/12/14 1932 07/12/14 2339 07/13/14 0348 07/13/14 0849  GLUCAP 136* 195* 220* 236* 147* 175*    Imaging No results found.   ASSESSMENT / PLAN:  PULMONARY Trach replaced 1/12 >> A:  Ventilator dependent respiratory failure COPD P:   She apparently was using vent at night at [facility], unclear how much ATC  she would tolerate. Consider extending ATC time every day to see if she can wean off vent completely. For now SIMV orders are in place for qhs VAP prevention per protocol SLP eval 1/15 Physical therapy 1/15  CARDIOVASCULAR L PICC A:  Hypotension in setting of acute GI bleed, resolved Prolonged QTc diastolic CHF Hx HTN PAF s/p MAZE aortic stenosis s/p aortic valve replacement 03/2014 P:  Transfusion as noted below Telemetry Hold home Lovenox, restart when OK with GI  RENAL A:  Hypernatremia P:   Free water  GASTROINTESTINAL A:   GI bleed Dysphagia s/p PEG tube EGD 1/12 >> gastritis,  ? Foreign body, small piece of tubing in the stomach P:   TF's running Protonix bid   HEMATOLOGIC A:   Acute on chronic anemia - Hgb 8.3 > 9.9 > 8.2 on 1/14 VTE ppx P:  Received protamine in ED Follow CBC Transfuse prn per ICU protocol SCDs  INFECTIOUS A:   GNR bacteremia, suspect GI translocation ?HCAP - minimal bibasilar opacities P:   BCx2 1/12 >> GNR >> Klebsiella, R to ceftaz!! Sputum 1/12 >> normal flora   Levofloxacin, start date 1/12 >> 1/13 Vancomycin, start date 1/12 >> 1/13 Ceftazidime,  start date 1/13 >> 1/15 Cipro, start 1/15 >>   Change to cipro based on cx data on 1/15; goal change to enteral over weekend and then back to [facility] next week    ENDOCRINE A:   Hypothyroidism  Hx hyperglycemia P:   Cont synthroid CBG Q4hr, SSI  NEUROLOGIC A:  No acute issues P:   Continue to monitor  FAMILY  - Updates: patient and son updated on 1/12  - Inter-disciplinary family meet or Palliative Care meeting due by:  07/17/2014  Move to SDU bed 1/13, still waiting for bed  Levy Pupa, MD, PhD 07/13/2014, 9:43 AM [place] [phone] or if no answer [phone]

## 2014-07-13 NOTE — Progress Notes (Signed)
eLink Physician-Brief Progress Note Patient Name: Samantha BoronJocelyn Paff DOB: 02/01/1935 MRN: [id number]   Date of Service  07/13/2014  HPI/Events of Note  A few bloody B< noted by RN, mixed with stool  Hemodynamics wnl  eICU Interventions  Cbc, coags     Intervention Category Intermediate Interventions: Bleeding - evaluation and treatment with blood products  Nelda BucksFEINSTEIN,DANIEL J. 07/13/2014, 4:01 PM

## 2014-07-13 NOTE — Progress Notes (Signed)
eLink Physician-Brief Progress Note Patient Name: Suzanne BoronJocelyn Northcraft DOB: 10/10/1934 MRN: [id number]   Date of Service  07/13/2014  HPI/Events of Note  Persistent hyperglycemia  eICU Interventions  Change to resistant scale insulin     Intervention Category Major Interventions: Hyperglycemia - active titration of insulin therapy  MCQUAID, DOUGLAS 07/13/2014, 12:27 AM

## 2014-07-14 LAB — CBC
HCT: 24 % — ABNORMAL LOW (ref 36.0–46.0)
HEMATOCRIT: 27.4 % — AB (ref 36.0–46.0)
HEMATOCRIT: 28.1 % — AB (ref 36.0–46.0)
Hemoglobin: 7.6 g/dL — ABNORMAL LOW (ref 12.0–15.0)
Hemoglobin: 8.4 g/dL — ABNORMAL LOW (ref 12.0–15.0)
Hemoglobin: 8.6 g/dL — ABNORMAL LOW (ref 12.0–15.0)
MCH: 30.3 pg (ref 26.0–34.0)
MCH: 29.1 pg (ref 26.0–34.0)
MCH: 29.1 pg (ref 26.0–34.0)
MCHC: 31.7 g/dL (ref 30.0–36.0)
MCHC: 30.7 g/dL (ref 30.0–36.0)
MCHC: 30.6 g/dL (ref 30.0–36.0)
MCV: 95.6 fL (ref 78.0–100.0)
MCV: 94.8 fL (ref 78.0–100.0)
MCV: 94.9 fL (ref 78.0–100.0)
PLATELETS: 138 10*3/uL — AB (ref 150–400)
PLATELETS: 144 10*3/uL — AB (ref 150–400)
Platelets: 120 10*3/uL — ABNORMAL LOW (ref 150–400)
RBC: 2.51 MIL/uL — ABNORMAL LOW (ref 3.87–5.11)
RBC: 2.89 MIL/uL — AB (ref 3.87–5.11)
RBC: 2.96 MIL/uL — AB (ref 3.87–5.11)
RDW: 17.3 % — AB (ref 11.5–15.5)
RDW: 17.4 % — AB (ref 11.5–15.5)
RDW: 17.4 % — ABNORMAL HIGH (ref 11.5–15.5)
WBC: 6.6 10*3/uL (ref 4.0–10.5)
WBC: 9 10*3/uL (ref 4.0–10.5)
WBC: 8.8 10*3/uL (ref 4.0–10.5)

## 2014-07-14 LAB — BASIC METABOLIC PANEL
ANION GAP: 3 — AB (ref 5–15)
BUN: 19 mg/dL (ref 6–23)
CO2: 32 mmol/L (ref 19–32)
CREATININE: 0.82 mg/dL (ref 0.50–1.10)
Calcium: 8.6 mg/dL (ref 8.4–10.5)
Chloride: 112 mEq/L (ref 96–112)
GFR calc non Af Amer: 67 mL/min — ABNORMAL LOW (ref >90)
GFR, EST AFRICAN AMERICAN: 78 mL/min — AB (ref >90)
Glucose, Bld: 150 mg/dL — ABNORMAL HIGH (ref 70–99)
POTASSIUM: 3.6 mmol/L (ref 3.5–5.1)
Sodium: 147 mmol/L — ABNORMAL HIGH (ref 135–145)

## 2014-07-14 LAB — GLUCOSE, CAPILLARY
GLUCOSE-CAPILLARY: 165 mg/dL — AB (ref 70–99)
GLUCOSE-CAPILLARY: 194 mg/dL — AB (ref 70–99)
Glucose-Capillary: 205 mg/dL — ABNORMAL HIGH (ref 70–99)
Glucose-Capillary: 173 mg/dL — ABNORMAL HIGH (ref 70–99)
Glucose-Capillary: 146 mg/dL — ABNORMAL HIGH (ref 70–99)
Glucose-Capillary: 262 mg/dL — ABNORMAL HIGH (ref 70–99)

## 2014-07-14 LAB — TYPE AND SCREEN
ABO/RH(D): O POS
ANTIBODY SCREEN: NEGATIVE
UNIT DIVISION: 0
UNIT DIVISION: 0
UNIT DIVISION: 0
Unit division: 0
Unit division: 0

## 2014-07-14 LAB — CULTURE, BLOOD (ROUTINE X 2)

## 2014-07-14 MED ORDER — ALPRAZOLAM 0.25 MG PO TABS
0.2500 mg | ORAL_TABLET | Freq: Two times a day (BID) | ORAL | Status: DC | PRN
  Administered 2014-07-14 – 2014-07-15 (×4): 0.25 mg via ORAL
  Filled 2014-07-14 (×4): qty 1

## 2014-07-14 MED ORDER — POTASSIUM CHLORIDE 20 MEQ/15ML (10%) PO SOLN
40.0000 meq | Freq: Three times a day (TID) | ORAL | Status: AC
  Administered 2014-07-14 (×2): 40 meq
  Filled 2014-07-14 (×3): qty 30

## 2014-07-14 MED ORDER — POTASSIUM CHLORIDE 10 MEQ/50ML IV SOLN
10.0000 meq | INTRAVENOUS | Status: AC
  Administered 2014-07-14 (×2): 10 meq via INTRAVENOUS
  Filled 2014-07-14 (×2): qty 50

## 2014-07-14 MED ORDER — FREE WATER
300.0000 mL | Status: DC
  Administered 2014-07-15 – 2014-07-16 (×11): 300 mL

## 2014-07-14 MED ORDER — FREE WATER
300.0000 mL | Status: DC
  Administered 2014-07-14 (×3): 300 mL

## 2014-07-14 MED ORDER — CHLORHEXIDINE GLUCONATE CLOTH 2 % EX PADS
6.0000 | MEDICATED_PAD | Freq: Every day | CUTANEOUS | Status: AC
  Administered 2014-07-14: 6 via TOPICAL

## 2014-07-14 MED ORDER — FUROSEMIDE 10 MG/ML IJ SOLN
40.0000 mg | Freq: Four times a day (QID) | INTRAMUSCULAR | Status: AC
  Administered 2014-07-14 – 2014-07-15 (×3): 40 mg via INTRAVENOUS
  Filled 2014-07-14 (×3): qty 4

## 2014-07-14 NOTE — Progress Notes (Signed)
SLP Cancellation Note  Patient Details Name: Suzanne BoronJocelyn Lear MRN: [id number] DOB: 11/30/1934   Cancelled treatment:       Reason Eval/Treat Not Completed: Patient not medically ready. Pt failed weaning trials today. Will f/u for readiness for PMSV. Discussed with RN.   Harlon DittyBonnie Caydee [place], MA CCC-SLP [phone]  Claudine MoutonDeBlois, Dawnya Grams Caroline 07/14/2014, 1:04 PM

## 2014-07-14 NOTE — Progress Notes (Signed)
PULMONARY / CRITICAL CARE MEDICINE   Name: Samantha Hubbard MRN: [id number] DOB: August 28, 1934    ADMISSION DATE:  07/10/2014 CONSULTATION DATE:  07/10/2014  REFERRING MD :  ED  CHIEF COMPLAINT:  GI bleed  INITIAL PRESENTATION: 80 year old woman with history of ventilator dependent respiratory failure with trach, dysphagia s/p PEG, diastolic CHF, COPD, chronic anemia, hypothyroidism, HTN, PAF s/p MAZE, aortic stenosis s/p aortic valve replacement, CKD, presenting from [facility] to [facility] ED with GI bleed.  STUDIES:  CXR 1/12 >> minimal bibasilar opacities  SIGNIFICANT EVENTS: 1/12 >> Active GI bleed in ED, GI consulted. Transfusing with 2u pRBC. Received protamine.  SUBJECTIVE: Failed weaning this AM.  Remains on the ventilator and is very anxious.  VITAL SIGNS: Temp:  [98.3 F (36.8 C)-99.5 F (37.5 C)] 98.4 F (36.9 C) (01/16 0800) Pulse Rate:  [87-112] 106 (01/16 1000) Resp:  [24-38] 25 (01/16 1000) BP: (100-167)/(58-78) 167/76 mmHg (01/16 1000) SpO2:  [100 %] 100 % (01/16 1000) FiO2 (%):  [40 %] 40 % (01/16 0747) Weight:  [63 kg (138 lb 14.2 oz)] 63 kg (138 lb 14.2 oz) (01/16 0600)  HEMODYNAMICS:   VENTILATOR SETTINGS: Vent Mode:  [-] SIMV;PRVC FiO2 (%):  [40 %] 40 % Set Rate:  [12 bmp] 12 bmp Vt Set:  [450 mL] 450 mL PEEP:  [5 cmH20] 5 cmH20 Pressure Support:  [10 cmH20] 10 cmH20 Plateau Pressure:  [14 cmH20-25 cmH20] 21 cmH20 INTAKE / OUTPUT:  Intake/Output Summary (Last 24 hours) at 07/14/14 1122 Last data filed at 07/14/14 1000  Gross per 24 hour  Intake   2525 ml  Output   1172 ml  Net   1353 ml   PHYSICAL EXAMINATION: General:  NAD Neuro:  Alert, able to mouth responses to questions, follows commands. No gross deficits HEENT:  [place]/AT, PERRL, EOMI, trach in place Cardiovascular:  Tachycardic regular rhythm Lungs:  Bilateral course rhonchi Abdomen:  Soft, nontender, mildly distended. PEG in place, CDI. Musculoskeletal:  No peripheral edema Skin:  No  lesions  LABS:  CBC  Recent Labs Lab 07/13/14 2000 07/14/14 0144 07/14/14 0751  WBC 6.7 6.6 9.0  HGB 7.9* 7.6* 8.4*  HCT 25.0* 24.0* 27.4*  PLT 125* 120* 138*   Coag's  Recent Labs Lab 07/10/14 0459 07/13/14 1730  APTT  --  32  INR 1.02 1.07   BMET  Recent Labs Lab 07/12/14 2000 07/13/14 0415 07/14/14 0144  NA 151* 149* 147*  K 4.1 3.6 3.6  CL 117* 115* 112  CO2 27 28 32  BUN 27* 23 19  CREATININE 1.04 0.89 0.82  GLUCOSE 266* 193* 150*   Electrolytes  Recent Labs Lab 07/12/14 2000 07/13/14 0415 07/14/14 0144  CALCIUM 9.0 8.6 8.6   Sepsis Markers  Recent Labs Lab 07/10/14 0516  LATICACIDVEN 0.81   ABG No results for input(s): PHART, PCO2ART, PO2ART in the last 168 hours. Liver Enzymes  Recent Labs Lab 07/10/14 0459  AST 17  ALT 24  ALKPHOS 79  BILITOT 0.3  ALBUMIN 2.4*   Cardiac Enzymes  Recent Labs Lab 07/10/14 0740 07/10/14 1340 07/10/14 1914  TROPONINI <0.03 <0.03 0.06*   Glucose  Recent Labs Lab 07/13/14 1157 07/13/14 1547 07/13/14 1938 07/13/14 2324 07/14/14 0402 07/14/14 0737  GLUCAP 228* 244* 137* 205* 173* 146*   Imaging No results found.  ASSESSMENT / PLAN:  PULMONARY Trach replaced 1/12 >> A:  Ventilator dependent respiratory failure COPD P:   She apparently was using vent at night at [facility] however  is now failing and needing diureses. VAP prevention per protocol. SLP eval 1/15. Physical therapy 1/15. Diureses.  CARDIOVASCULAR L PICC A:  Hypotension in setting of acute GI bleed, resolved Prolonged QTc diastolic CHF Hx HTN PAF s/p MAZE aortic stenosis s/p aortic valve replacement 03/2014 P:  Transfusion as noted below Telemetry Hold home Lovenox, restart when OK with GI  RENAL A:  Hypernatremia P:   Free water increase to 300 ml q6 hours. Lasix 40 mg IV q6 x3 doses. Replace electrolytes as indicated. BMET in AM.  GASTROINTESTINAL A:   GI bleed Dysphagia s/p PEG tube EGD 1/12  >> gastritis, ? Foreign body, small piece of tubing in the stomach P:   TF's running Protonix bid  HEMATOLOGIC A:   Acute on chronic anemia - Hgb 8.3 > 9.9 > 8.2 on 1/14 VTE ppx P:  Received protamine in ED Follow CBC Transfuse prn per ICU protocol SCDs  INFECTIOUS A:   GNR bacteremia, suspect GI translocation ?HCAP - minimal bibasilar opacities P:   BCx2 1/12 >> GNR >> Klebsiella, R to ceftaz!! Sputum 1/12 >> normal flora   Levofloxacin, start date 1/12 >> 1/13 Vancomycin, start date 1/12 >> 1/13 Ceftazidime,  start date 1/13 >> 1/15 Cipro, start 1/15 >>   Change to cipro based on cx data on 1/15; goal change to enteral over weekend and then back to [facility] next week  ENDOCRINE A:   Hypothyroidism  Hx hyperglycemia P:   Cont synthroid CBG Q4hr, SSI  NEUROLOGIC A:  No acute issues P:   Continue to monitor  FAMILY  - Updates: no family bedside.  - Inter-disciplinary family meet or Palliative Care meeting due by:  07/17/2014  The patient is critically ill with multiple organ systems failure and requires high complexity decision making for assessment and support, frequent evaluation and titration of therapies, application of advanced monitoring technologies and extensive interpretation of multiple databases.   Critical Care Time devoted to patient care services described in this note is  35  Minutes. This time reflects time of care of this signee Dr Koren BoundWesam Aixa Corsello. This critical care time does not reflect procedure time, or teaching time or supervisory time of PA/NP/Med student/Med Resident etc but could involve care discussion time.  Alyson ReedyWesam G. Adalida Garver, M.D. [facility] Pulmonary/Critical Care Medicine. Pager: [phone]. After hours pager: [phone].

## 2014-07-15 ENCOUNTER — Inpatient Hospital Stay (HOSPITAL_COMMUNITY): Payer: Medicare Other

## 2014-07-15 LAB — CBC
HCT: 26.9 % — ABNORMAL LOW (ref 36.0–46.0)
Hemoglobin: 8.4 g/dL — ABNORMAL LOW (ref 12.0–15.0)
MCH: 29.1 pg (ref 26.0–34.0)
MCHC: 31.2 g/dL (ref 30.0–36.0)
MCV: 93.1 fL (ref 78.0–100.0)
Platelets: 132 10*3/uL — ABNORMAL LOW (ref 150–400)
RBC: 2.89 MIL/uL — ABNORMAL LOW (ref 3.87–5.11)
RDW: 17 % — ABNORMAL HIGH (ref 11.5–15.5)
WBC: 8.9 10*3/uL (ref 4.0–10.5)

## 2014-07-15 LAB — BASIC METABOLIC PANEL
Anion gap: 5 (ref 5–15)
BUN: 21 mg/dL (ref 6–23)
CHLORIDE: 97 meq/L (ref 96–112)
CO2: 35 mmol/L — AB (ref 19–32)
CREATININE: 0.84 mg/dL (ref 0.50–1.10)
Calcium: 8.6 mg/dL (ref 8.4–10.5)
GFR calc Af Amer: 76 mL/min — ABNORMAL LOW (ref >90)
GFR calc non Af Amer: 65 mL/min — ABNORMAL LOW (ref >90)
Glucose, Bld: 195 mg/dL — ABNORMAL HIGH (ref 70–99)
Potassium: 4.6 mmol/L (ref 3.5–5.1)
Sodium: 137 mmol/L (ref 135–145)

## 2014-07-15 LAB — GLUCOSE, CAPILLARY
Glucose-Capillary: 148 mg/dL — ABNORMAL HIGH (ref 70–99)
Glucose-Capillary: 159 mg/dL — ABNORMAL HIGH (ref 70–99)
Glucose-Capillary: 170 mg/dL — ABNORMAL HIGH (ref 70–99)
Glucose-Capillary: 181 mg/dL — ABNORMAL HIGH (ref 70–99)
Glucose-Capillary: 182 mg/dL — ABNORMAL HIGH (ref 70–99)
Glucose-Capillary: 194 mg/dL — ABNORMAL HIGH (ref 70–99)
Glucose-Capillary: 197 mg/dL — ABNORMAL HIGH (ref 70–99)

## 2014-07-15 LAB — PHOSPHORUS: Phosphorus: 1.7 mg/dL — ABNORMAL LOW (ref 2.3–4.6)

## 2014-07-15 LAB — MAGNESIUM: MAGNESIUM: 1.8 mg/dL (ref 1.5–2.5)

## 2014-07-15 MED ORDER — FENTANYL CITRATE 0.05 MG/ML IJ SOLN
50.0000 ug | Freq: Once | INTRAMUSCULAR | Status: AC
  Administered 2014-07-15: 50 ug via INTRAVENOUS

## 2014-07-15 MED ORDER — SIMETHICONE 40 MG/0.6ML PO SUSP
40.0000 mg | Freq: Four times a day (QID) | ORAL | Status: DC | PRN
  Administered 2014-07-15: 40 mg
  Filled 2014-07-15 (×2): qty 0.6

## 2014-07-15 MED ORDER — FENTANYL CITRATE 0.05 MG/ML IJ SOLN
INTRAMUSCULAR | Status: AC
  Filled 2014-07-15: qty 2

## 2014-07-15 NOTE — Progress Notes (Signed)
PULMONARY / CRITICAL CARE MEDICINE   Name: Samantha Hubbard MRN: [id number] DOB: 10/19/1934    ADMISSION DATE:  07/10/2014 CONSULTATION DATE:  07/10/2014  REFERRING MD :  ED  CHIEF COMPLAINT:  GI bleed  INITIAL PRESENTATION: 80 year old woman with history of ventilator dependent respiratory failure with trach, dysphagia s/p PEG, diastolic CHF, COPD, chronic anemia, hypothyroidism, HTN, PAF s/p MAZE, aortic stenosis s/p aortic valve replacement, CKD, [facility] bleed.  STUDIES:  CXR 1/12 >> minimal bibasilar opacities  SIGNIFICANT EVENTS: 1/12 >> Active GI bleed in ED, GI consulted. Transfusing with 2u pRBC. Received protamine.  SUBJECTIVE: Still on full vent support  VITAL SIGNS: Temp:  [98 F (36.7 C)-98.8 F (37.1 C)] 98.8 F (37.1 C) (01/17 0758) Pulse Rate:  [87-106] 96 (01/17 0600) Resp:  [16-34] 28 (01/17 0600) BP: (104-167)/(54-89) 106/56 mmHg (01/17 0400) SpO2:  [93 %-100 %] 100 % (01/17 0600) FiO2 (%):  [40 %] 40 % (01/17 0340) Weight:  [131 lb 9.8 oz (59.7 kg)] 131 lb 9.8 oz (59.7 kg) (01/17 0500)  HEMODYNAMICS:   VENTILATOR SETTINGS: Vent Mode:  [-] SIMV;PRVC FiO2 (%):  [40 %] 40 % Set Rate:  [12 bmp] 12 bmp Vt Set:  [450 mL] 450 mL PEEP:  [5 cmH20] 5 cmH20 Pressure Support:  [10 cmH20] 10 cmH20 Plateau Pressure:  [10 cmH20-21 cmH20] 18 cmH20 INTAKE / OUTPUT:  Intake/Output Summary (Last 24 hours) at 07/15/14 [id number] Last data filed at 07/15/14 0700  Gross per 24 hour  Intake   3105 ml  Output   5155 ml  Net  -2050 ml   PHYSICAL EXAMINATION: General:  NAD Neuro:  Alert, able to mouth responses to questions, follows commands. No gross deficits HEENT:  [place]/AT, PERRL, EOMI, trach in place Cardiovascular:  Tachycardic regular rhythm Lungs:  Bilateral course rhonchi, decreased in bases Abdomen:  Soft, nontender, mildly distended. PEG in place, CDI. Musculoskeletal:  No peripheral edema Skin:  No  lesions  LABS:  CBC  Recent Labs Lab 07/14/14 0751 07/14/14 1351 07/15/14 0425  WBC 9.0 8.8 8.9  HGB 8.4* 8.6* 8.4*  HCT 27.4* 28.1* 26.9*  PLT 138* 144* 132*   Coag's  Recent Labs Lab 07/10/14 0459 07/13/14 1730  APTT  --  32  INR 1.02 1.07   BMET  Recent Labs Lab 07/13/14 0415 07/14/14 0144 07/15/14 0425  NA 149* 147* 137  K 3.6 3.6 4.6  CL 115* 112 97  CO2 28 32 35*  BUN 23 19 21   CREATININE 0.89 0.82 0.84  GLUCOSE 193* 150* 195*   Electrolytes  Recent Labs Lab 07/13/14 0415 07/14/14 0144 07/15/14 0425  CALCIUM 8.6 8.6 8.6  MG  --   --  1.8  PHOS  --   --  1.7*   Sepsis Markers  Recent Labs Lab 07/10/14 0516  LATICACIDVEN 0.81   ABG No results for input(s): PHART, PCO2ART, PO2ART in the last 168 hours. Liver Enzymes  Recent Labs Lab 07/10/14 0459  AST 17  ALT 24  ALKPHOS 79  BILITOT 0.3  ALBUMIN 2.4*   Cardiac Enzymes  Recent Labs Lab 07/10/14 0740 07/10/14 1340 07/10/14 1914  TROPONINI <0.03 <0.03 0.06*   Glucose  Recent Labs Lab 07/14/14 0737 07/14/14 1129 07/14/14 1552 07/14/14 2018 07/15/14 0004 07/15/14 0423  GLUCAP 146* 262* 165* 194* 194* 197*   Imaging No results found.  Intake/Output Summary (Last 24 hours) at 07/15/14 0826 Last data filed at 07/15/14 0700  Gross  per 24 hour  Intake   3105 ml  Output   5155 ml  Net  -2050 ml   ASSESSMENT / PLAN:  PULMONARY Trach replaced 1/12 >> A:  Ventilator dependent respiratory failure COPD P:   She apparently was using vent at night at [facility] however is now failing and needing diureses. VAP prevention per protocol. SLP eval 1/15. Physical therapy 1/15. Diureses.  CARDIOVASCULAR L PICC A:  Hypotension in setting of acute GI bleed, resolved Prolonged QTc diastolic CHF Hx HTN PAF s/p MAZE aortic stenosis s/p aortic valve replacement 03/2014 P:  Transfusion as noted below Telemetry Hold home Lovenox, restart when OK with GI  RENAL  Recent  Labs Lab 07/13/14 0415 07/14/14 0144 07/15/14 0425  NA 149* 147* 137    A:  Hypernatremia (resolved) P:   Free water increase to 300 ml q6 hours. Replace electrolytes as indicated. BMET in AM.  GASTROINTESTINAL A:   GI bleed Dysphagia s/p PEG tube EGD 1/12 >> gastritis, ? Foreign body, small piece of tubing in the stomach P:   TF's running Protonix bid Speech is evaling   HEMATOLOGIC A:   Acute on chronic anemia   Recent Labs  07/14/14 1351 07/15/14 0425  HGB 8.6* 8.4*    VTE ppx P:  Received protamine in ED Follow CBC Transfuse prn per ICU protocol SCDs  INFECTIOUS A:   GNR bacteremia, suspect GI translocation ?HCAP - minimal bibasilar opacities P:   BCx2 1/12 >> GNR >> Klebsiella, R to ceftaz!! Sputum 1/12 >> normal flora   Levofloxacin, start date 1/12 >> 1/13 Vancomycin, start date 1/12 >> 1/13 Ceftazidime,  start date 1/13 >> 1/15 Cipro, start 1/15 >>   Change to cipro based on cx data on 1/15; goal change to enteral over weekend and then back to [facility] next week  ENDOCRINE CBG (last 3)   Recent Labs  07/14/14 2018 07/15/14 0004 07/15/14 0423  GLUCAP 194* 194* 197*    A:   Hypothyroidism  Hx hyperglycemia P:   Cont synthroid CBG Q4hr, SSI  NEUROLOGIC A:  No acute issues P:   Continue to monitor  FAMILY  - Updates: no family bedside.  - Inter-disciplinary family meet or Palliative Care meeting due by:  07/17/2014  Todays summary: Tolerates T collar 2-4 hours at a time. GIB resolved with Hgb 8.4. Now SDU status. Consider her returning to [facility] hospital.  Brett Canales Minor ACNP Adolph Pollack PCCM Pager [phone] till 3 pm If no answer page [phone] 07/15/2014, 8:22 AM  Tolerating TC for 2-4 hours at this point.  GI bleeding resolves.  Remains going back to the vent at night.  Transfer order to SDU vent bed in place.  Ready to transfer to [facility] when a bed is available.  Patient seen and examined, agree with above note.  I  dictated the care and orders written for this patient under my direction.  Alyson Reedy, MD [phone]

## 2014-07-16 DIAGNOSIS — I359 Nonrheumatic aortic valve disorder, unspecified: Secondary | ICD-10-CM

## 2014-07-16 LAB — GLUCOSE, CAPILLARY
GLUCOSE-CAPILLARY: 158 mg/dL — AB (ref 70–99)
Glucose-Capillary: 189 mg/dL — ABNORMAL HIGH (ref 70–99)
Glucose-Capillary: 206 mg/dL — ABNORMAL HIGH (ref 70–99)
Glucose-Capillary: 148 mg/dL — ABNORMAL HIGH (ref 70–99)
Glucose-Capillary: 98 mg/dL (ref 70–99)

## 2014-07-16 LAB — BASIC METABOLIC PANEL
Anion gap: 4 — ABNORMAL LOW (ref 5–15)
BUN: 18 mg/dL (ref 6–23)
CALCIUM: 8.7 mg/dL (ref 8.4–10.5)
CO2: 32 mmol/L (ref 19–32)
CREATININE: 0.69 mg/dL (ref 0.50–1.10)
Chloride: 95 mEq/L — ABNORMAL LOW (ref 96–112)
GFR calc non Af Amer: 82 mL/min — ABNORMAL LOW (ref >90)
Glucose, Bld: 193 mg/dL — ABNORMAL HIGH (ref 70–99)
POTASSIUM: 4.2 mmol/L (ref 3.5–5.1)
Sodium: 131 mmol/L — ABNORMAL LOW (ref 135–145)

## 2014-07-16 LAB — CBC
HCT: 26.6 % — ABNORMAL LOW (ref 36.0–46.0)
Hemoglobin: 8.5 g/dL — ABNORMAL LOW (ref 12.0–15.0)
MCH: 29.3 pg (ref 26.0–34.0)
MCHC: 32 g/dL (ref 30.0–36.0)
MCV: 91.7 fL (ref 78.0–100.0)
Platelets: 137 10*3/uL — ABNORMAL LOW (ref 150–400)
RBC: 2.9 MIL/uL — AB (ref 3.87–5.11)
RDW: 16.4 % — AB (ref 11.5–15.5)
WBC: 8.3 10*3/uL (ref 4.0–10.5)

## 2014-07-16 MED ORDER — FREE WATER: 300.0000 mL | Status: AC

## 2014-07-16 MED ORDER — CIPROFLOXACIN IN D5W 400 MG/200ML IV SOLN: 400.0000 mg | Freq: Two times a day (BID) | INTRAVENOUS | Status: AC

## 2014-07-16 MED ORDER — ACETAMINOPHEN 160 MG/5ML PO SOLN
650.0000 mg | Freq: Four times a day (QID) | ORAL | Status: DC | PRN
  Administered 2014-07-16: 650 mg
  Filled 2014-07-16: qty 20.3

## 2014-07-16 MED ORDER — LEVOTHYROXINE SODIUM 200 MCG PO TABS
200.0000 ug | ORAL_TABLET | Freq: Every day | ORAL | Status: DC
  Administered 2014-07-16: 200 ug
  Filled 2014-07-16 (×2): qty 1

## 2014-07-16 MED ORDER — PANTOPRAZOLE SODIUM 40 MG PO PACK
40.0000 mg | PACK | Freq: Two times a day (BID) | ORAL | Status: DC
  Administered 2014-07-16: 40 mg
  Filled 2014-07-16 (×2): qty 20

## 2014-07-16 MED ORDER — CARVEDILOL 3.125 MG PO TABS
3.1250 mg | ORAL_TABLET | Freq: Two times a day (BID) | ORAL | Status: DC
  Administered 2014-07-16: 3.125 mg
  Filled 2014-07-16 (×2): qty 1

## 2014-07-16 MED ORDER — ATORVASTATIN CALCIUM 40 MG PO TABS
40.0000 mg | ORAL_TABLET | Freq: Every day | ORAL | Status: DC
Start: 2014-07-16 — End: 2014-07-17
  Administered 2014-07-16: 40 mg
  Filled 2014-07-16: qty 1

## 2014-07-16 MED ORDER — PANTOPRAZOLE SODIUM 40 MG PO PACK: 40.0000 mg | PACK | Freq: Two times a day (BID) | ORAL | Status: AC

## 2014-07-16 MED ORDER — SERTRALINE HCL 20 MG/ML PO CONC
100.0000 mg | Freq: Every day | ORAL | Status: DC
  Filled 2014-07-16: qty 5

## 2014-07-16 MED ORDER — SERTRALINE HCL 100 MG PO TABS
100.0000 mg | ORAL_TABLET | Freq: Every day | ORAL | Status: DC
Start: 2014-07-16 — End: 2014-07-17
  Administered 2014-07-16: 100 mg
  Filled 2014-07-16: qty 1

## 2014-07-16 NOTE — Progress Notes (Signed)
Water bottle and set up changed 

## 2014-07-16 NOTE — Evaluation (Signed)
[facility] Speaking Valve - Evaluation Patient Details  Name: Samantha BoronJocelyn Hubbard MRN: [id number] Date of Birth: 02/05/1935  Today's Date: 07/16/2014 Time: [id number] SLP Time Calculation (min) (ACUTE ONLY): 22 min  Past Medical History:  Past Medical History  Diagnosis Date  . Acute respiratory failure with hypoxia and hypercapnia   . Ventilator dependent   . Healthcare-associated pneumonia   . Pneumonia, Klebsiella   . Left ventricular diastolic dysfunction   . Chronic pain syndrome   . Critical illness myopathy   . Severe protein-calorie malnutrition   . Dysphagia   . COPD (chronic obstructive pulmonary disease)   . Anemia of chronic disease   . Thyroid disease     Hypothyroidism  . Depression   . Anxiety   . Hypertension   . Thrush   . Hypernatremia   . Paroxysmal a-fib 10/15    status post Maze   . Aortic stenosis 10/15    status post AV replacement   . Electrolyte imbalance   . Renal disorder   . History of hemodialysis   . Hyperglycemia   . Encounter for wound care   . Insomnia   . Peripheral vascular disease   . Generalized weakness   . Chronic low back pain   . Emphysema of lung   . Hyperlipemia   . Allergic rhinitis    Past Surgical History:  Past Surgical History  Procedure Laterality Date  . Esophagogastroduodenoscopy N/A 07/10/2014    Procedure: ESOPHAGOGASTRODUODENOSCOPY (EGD);  Surgeon: Louis Meckelobert D Kaplan, MD;  [facility];  Service: Endoscopy;  Laterality: N/A;   HPI:  [age] year old woman with history of ventilator dependent respiratory failure with trach, dysphagia s/p PEG, diastolic CHF, COPD, chronic anemia, hypothyroidism, HTN, PAF s/p MAZE, aortic stenosis s/p aortic valve replacement, CKD, [facility] bleed.   Assessment / Plan / Recommendation Clinical Impression  Patient presented with generally good tolerance of PMSV following clearance of secretions both orally and via trach with RN suctioning.  Patient able to acheive low pitch, hoarse vocal quality with moderately reduced breath support (average 2-3 words per breath) with moderate SLP cueing for deep breath prior to verbal output to increase intelligibility. Trial limited due to patient fatigue, requesting for valve to be removed and to rest. Recommend PMSV with SLP only. Per RN, possible transfer to [facility] today. Will defer further f/u to [facility] unless [facility].     SLP Assessment  Patient needs continued Speech Lanaguage Pathology Services    Follow Up Recommendations  LTACH    Frequency and Duration min 2x/week  2 weeks   Pertinent Vitals/Pain n/a    SLP Goals Potential to Achieve Goals (ACUTE ONLY): Good   PMSV Trial  PMSV was placed for: 5 minutes Able to redirect subglottic air through upper airway: Yes Able to Attain Phonation: Yes Voice Quality: Hoarse (low pitch) Able to Expectorate Secretions: Yes Level of Secretion Expectoration with PMSV: Oral Breath Support for Phonation: Moderately decreased Intelligibility: Intelligible Respirations During Trial: 18 SpO2 During Trial: 100 % Pulse During Trial: 97 Behavior: Alert;Controlled   Tracheostomy Tube  Additional Tracheostomy Tube Assessment Fenestrated: No Secretion Description: moderate Frequency of Tracheal Suctioning: suctioned by RN prior to pmv placement Level of Secretion Expectoration: Tracheal;Oral    Vent Dependency  Vent Dependent: Yes (currently weaning with TC trials) FiO2 (%): 40 %    Cuff Deflation Trial Tolerated Cuff Deflation: Yes Length of Time for Cuff Deflation Trial: 10 minutes Behavior:  Alert Cuff Deflation Trial - Comments: improved following expectoration of oral/tracheal secretions and suctioning   Samantha Hubbard 07/16/2014, 3:06 PM   Samantha Lango MA, CCC-SLP [phone]

## 2014-07-16 NOTE — Evaluation (Signed)
Occupational Therapy Evaluation Patient Details Name: Samantha Hubbard MRN: [id number] DOB: [birth year] Today's Date: 07/16/2014    History of Present Illness 80 year old woman with history of ventilator dependent respiratory failure with trach, dysphagia s/p PEG, diastolic CHF, COPD, chronic anemia, hypothyroidism, HTN, PAF s/p MAZE, aortic stenosis s/p aortic valve replacement, CKD, presenting from [facility] to [facility] ED with GI bleed.   Clinical Impression   This 80 yo female admitted with above presents to acute OT with generalized weakness, decreased mobility, anxiety, decreased balance--all affecting her ability to care for herself since Nov 2015. She will benefit from acute OT with follow up at [facility] to work towards a S or better level of function.    Follow Up Recommendations  LTACH    Equipment Recommendations  None recommended by OT       Precautions / Restrictions Precautions Precautions: Fall Precaution Comments: trach, vent, PEG Restrictions Weight Bearing Restrictions: No      Mobility Bed Mobility Overal bed mobility: Needs Assistance Bed Mobility: Sit to Supine       Sit to supine: Mod assist   General bed mobility comments: assist to bring legs onto surface, achieve midline with cues for sequence   Transfers Overall transfer level: Needs assistance   Transfers: Sit to/from Stand;Stand Pivot Transfers Sit to Stand: +2 physical assistance;+2 safety/equipment;Max assist Stand pivot transfers: Max assist;+2 physical assistance;+2 safety/equipment       General transfer comment: cues for hand placement, anterior translation and safety. Pt stood from chair x 2 with first trial grossly 45 sec with cues for hand placement. 2nd trial stood and pivoted with RW to bed with max cues and assist for hip extension, position in RW, stepping and hand placement    Balance Overall balance assessment: Needs assistance   Sitting balance-Leahy Scale: Fair        Standing balance-Leahy Scale: Poor                              ADL Overall ADL's : Needs assistance/impaired Eating/Feeding: NPO   Grooming: Moderate assistance;Sitting   Upper Body Bathing: Moderate assistance;Sitting   Lower Body Bathing:  (with +2 max A sit<>stand)   Upper Body Dressing : Maximal assistance;Sitting   Lower Body Dressing: Total assistance (with +2 max A sit<>stand)   Toilet Transfer: Maximal assistance;+2 for physical assistance;Stand-pivot;RW (Recliner>bed)   Toileting- Clothing Manipulation and Hygiene: Total assistance (with max A +2 sit<>stand)                         Pertinent Vitals/Pain Pain Assessment: 0-10 Pain Score: 4  Pain Location: chronic back pain Pain Descriptors / Indicators: Aching Pain Intervention(s): Repositioned     Hand Dominance Right   Extremity/Trunk Assessment Upper Extremity Assessment Upper Extremity Assessment: Overall WFL for tasks assessed              Cognition Arousal/Alertness: Awake/alert Behavior During Therapy: WFL for tasks assessed/performed;Anxious Overall Cognitive Status: Within Functional Limits for tasks assessed                                Home Living Family/patient expects to be discharged to:: Other (Comment) (LTACH)  Prior Functioning/Environment   [facility] for [facility] mobility and BADLs            OT Diagnosis: Generalized weakness   OT Problem List: Decreased strength;Decreased activity tolerance;Impaired balance (sitting and/or standing);Pain;Cardiopulmonary status limiting activity   OT Treatment/Interventions: Self-care/ADL training;Therapeutic activities;DME and/or AE instruction;Balance training;Patient/family education    OT Goals(Current goals can be found in the care plan section) Acute Rehab OT Goals Patient Stated Goal: Be able to move by myself again OT Goal Formulation:  With patient Time For Goal Achievement: 07/30/14 Potential to Achieve Goals: Good  OT Frequency: Min 2X/week   Barriers to D/C: Decreased caregiver support          Co-evaluation PT/OT/SLP Co-Evaluation/Treatment: Yes Reason for Co-Treatment: Complexity of the patient's impairments (multi-system involvement) PT goals addressed during session: Mobility/safety with mobility;Balance;Proper use of DME OT goals addressed during session: ADL's and self-care;Strengthening/ROM      End of Session Equipment Utilized During Treatment: Rolling walker  Activity Tolerance: Patient limited by fatigue Patient left: in bed;with call bell/phone within reach   Time: 0936-1008 OT Time Calculation (min): 32 min Charges:  OT General Charges $OT Visit: 1 Procedure OT Evaluation $Initial OT Evaluation Tier I: 1 Procedure OT Treatments $Self Care/Home Management : 8-22 mins  Evette GeorgesLeonard, Sujay Grundman Eva [phone] 07/16/2014, 12:37 PM

## 2014-07-16 NOTE — Progress Notes (Addendum)
PULMONARY / CRITICAL CARE MEDICINE   Name: Samantha BoronJocelyn Hubbard MRN: [id number] DOB: 04/24/1935    ADMISSION DATE:  07/10/2014 CONSULTATION DATE:  07/10/2014  REFERRING MD :  ED  CHIEF COMPLAINT:  GI bleed  INITIAL PRESENTATION: 80 year old woman with history of ventilator dependent respiratory failure with trach, dysphagia s/p PEG, diastolic CHF, COPD, chronic anemia, hypothyroidism, HTN, PAF s/p MAZE, aortic stenosis s/p aortic valve replacement, CKD, presenting from [facility] to [facility]  with GI bleed.  STUDIES:  CXR 1/12 >> minimal bibasilar opacities  SIGNIFICANT EVENTS: 1/12 >> Active GI bleed in ED, GI consulted. Transfusing with 2u pRBC. Received protamine.  SUBJECTIVE: No distress.  VITAL SIGNS: Temp:  [97.7 F (36.5 C)-98.2 F (36.8 C)] 98.2 F (36.8 C) (01/18 0315) Pulse Rate:  [87-98] 97 (01/18 0727) Resp:  [16-34] 23 (01/18 0727) BP: (103-164)/(58-82) 123/74 mmHg (01/18 0600) SpO2:  [100 %] 100 % (01/18 0727) FiO2 (%):  [40 %] 40 % (01/18 0727) Weight:  [133 lb 6.1 oz (60.5 kg)] 133 lb 6.1 oz (60.5 kg) (01/18 0600)  VENTILATOR SETTINGS: Vent Mode:  [-] SIMV;PRVC FiO2 (%):  [40 %] 40 % Set Rate:  [12 bmp] 12 bmp Vt Set:  [450 mL] 450 mL PEEP:  [5 cmH20] 5 cmH20 INTAKE / OUTPUT:  Intake/Output Summary (Last 24 hours) at 07/16/14 0929 Last data filed at 07/16/14 0758  Gross per 24 hour  Intake   3320 ml  Output   1925 ml  Net   1395 ml   PHYSICAL EXAMINATION: General: sitting in chair Neuro: normal strength HEENT: trach site clean Cardiovascular: regular Lungs: no wheeze Abdomen: PEG site clean Musculoskeletal: no edema Skin: no rashes  LABS:  CBC  Recent Labs Lab 07/14/14 1351 07/15/14 0425 07/16/14 0440  WBC 8.8 8.9 8.3  HGB 8.6* 8.4* 8.5*  HCT 28.1* 26.9* 26.6*  PLT 144* 132* 137*   Coag's  Recent Labs Lab 07/10/14 0459 07/13/14 1730  APTT  --  32  INR 1.02 1.07   BMET  Recent Labs Lab 07/14/14 0144 07/15/14 0425  07/16/14 0440  NA 147* 137 131*  K 3.6 4.6 4.2  CL 112 97 95*  CO2 32 35* 32  BUN 19 21 18   CREATININE 0.82 0.84 0.69  GLUCOSE 150* 195* 193*   Electrolytes  Recent Labs Lab 07/14/14 0144 07/15/14 0425 07/16/14 0440  CALCIUM 8.6 8.6 8.7  MG  --  1.8  --   PHOS  --  1.7*  --    Sepsis Markers  Recent Labs Lab 07/10/14 0516  LATICACIDVEN 0.81   Liver Enzymes  Recent Labs Lab 07/10/14 0459  AST 17  ALT 24  ALKPHOS 79  BILITOT 0.3  ALBUMIN 2.4*   Cardiac Enzymes  Recent Labs Lab 07/10/14 0740 07/10/14 1340 07/10/14 1914  TROPONINI <0.03 <0.03 0.06*   Glucose  Recent Labs Lab 07/15/14 1145 07/15/14 1525 07/15/14 1944 07/15/14 2334 07/16/14 0339 07/16/14 0736  GLUCAP 182* 170* 148* 181* 158* 189*   Imaging Dg Chest Port 1 View  07/15/2014   CLINICAL DATA:  Subsequent encounter for shortness of breath. Endotracheal tube.  EXAM: PORTABLE CHEST - 1 VIEW  COMPARISON:  07/10/2014  FINDINGS: Left PICC line terminates at mid to low SVC. Tracheostomy appropriately positioned. Prior median sternotomy. Cardiomegaly accentuated by AP portable technique. Probable small left pleural effusion. No pneumothorax. Slight worsening in bibasilar airspace disease. Mild interstitial edema.  IMPRESSION: Slight worsening aeration, with development of mild interstitial edema. Increased bibasilar opacities which  could represent atelectasis or early infection.  Probable small left pleural effusion.   Electronically Signed   By: Jeronimo Greaves M.D.   On: 07/15/2014 07:58   ASSESSMENT / PLAN:  PULMONARY Trach replaced 1/12 >> A:  Chronic respiratory failure s/p tracheostomy with vent dependence at night. Hx of COPD. P:   Trach collar as tolerated Speech to assess for PM valve Scheduled duoneb, pulmicort F/u CXR as needed  CARDIOVASCULAR L PICC A:  Hypotension 2nd to GI bleed >> resolved. Hx of HTN, chronic diastolic dysfx, PAF s/p MAZE, AS s/p AVR in October 2015. P:   Resume lovenox, ASA when okay with GI Lipitor, amiodarone, coreg Check TTE in setting of GNR bacteremia  RENAL A:   Hypernatremia (resolved) P:   Continue free water  F/u BMET intermittently  GASTROINTESTINAL A:   Upper GI bleed >> resolved. Dysphagia s/p PEG tube. P:   Tube feeds Protonix BID Speech to assess swallowing   HEMATOLOGIC A:   Acute on chronic anemia 2nd to GI bleed. P:  F/u CBC  INFECTIOUS A:   GNR bacteremia, suspect GI translocation ?HCAP - minimal bibasilar opacities P:   Day 7 of Abx, day 4 of cipro Check TTE, f/u blood cx  BCx2 1/12 >> GNR >> Klebsiella, R to ceftaz  ENDOCRINE A:   Hypothyroidism  Hx hyperglycemia P:   Continue synthroid SSI  NEUROLOGIC A:  Depression. Deconditioning. P:   zoloft PT  SUMMARY: She is stable for transfer back to [facility] when bed available if Echo is unrevealing.  Otherwise will transfer to vent SDU.  Coralyn Helling, MD [facility] Pulmonary/Critical Care 07/16/2014, 9:37 AM Pager:  [phone] After 3pm call: [phone]

## 2014-07-16 NOTE — Clinical Social Work Psychosocial (Signed)
Clinical Social Work Department BRIEF PSYCHOSOCIAL ASSESSMENT 07/16/2014  Patient:  Samantha Hubbard,Samantha Hubbard     Account Number:  [id number]     Admit date:  07/10/2014  Clinical Social Worker:  Elouise MunroeANTERHAUS,Javonne Dorko, LCSWA  Date/Time:  07/16/2014 02:30 PM  Referred by:  Physician  Date Referred:  07/16/2014 Referred for  SNF Placement   Other Referral:   Interview type:  Patient Other interview type:    PSYCHOSOCIAL DATA Living Status:  FACILITY Admitted from facility:  Other Level of care:  Skilled Nursing Facility Primary support name:   Primary support relationship to patient:  FAMILY Degree of support available:   Patient has a son, but due to patient being dependent on ventilator, he can not help take care of her.    CURRENT CONCERNS Current Concerns  Post-Acute Placement   Other Concerns:   Patient is dependent on a ventilator and has a trach collar as well.    SOCIAL WORK ASSESSMENT / PLAN Patient is a 80 year old female who is dependent on a ventilator and has a trach collar who needs suctioning as well from [facility].  Patient was responsive while talking, but had difficulty speaking due to trach collar. Patient was able to nod her head and use body language to express how she is feeling.  Patient's plan is to return back to [facility] due to ventilator.   Assessment/plan status:  Psychosocial Support/Ongoing Assessment of Needs Other assessment/ plan:   Information/referral to community resources:    PATIENT'S/FAMILY'S RESPONSE TO PLAN OF CARE: Patient in agreement to returning back to [facility] for SNF services.    Ervin KnackEric R. Kam Hubbard, MSW, Theresia MajorsLCSWA [phone] 07/16/2014 5:08 PM

## 2014-07-16 NOTE — Discharge Summary (Signed)
Physician Discharge Summary       Patient ID: Samantha Hubbard MRN: [id number] DOB/AGE: [birth year] 80 y.o.  Admit date: 07/10/2014 Discharge date: 07/16/2014  Discharge Diagnoses:  Active Problems:   GI bleed   Acute on chronic respiratory failure   Tracheostomy status   Acute post-hemorrhagic anemia   Bacteremia due to Gram-negative bacteria   Feeding difficulties  PAF s/p MAZE  COPD  AVR  Detailed Hospital Course:   80 year old woman with history of ventilator dependent respiratory failure with trach, dysphagia s/p PEG, diastolic CHF, COPD, chronic anemia, hypothyroidism, HTN, PAF s/p MAZE, aortic stenosis s/p aortic valve replacement, CKD presenting from [facility] to [facility] ED with GI bleed. Per ED notes, she had two diapers with bloody stools with clots. She denies fevers, chills, CP, SOB, N/V, abdominal pain. Per her son, she had hemetemesis and underwent EGD just before Christmas. She was found to have peptic ulcer that was clipped. No problems since then. She is on ASA  daily and Lovenox  daily. Her hgb was found to be low and she received 2 units PRBC and protamine in ED. She was also started on empiric antibiotics for potential HCAP.She underwent bedside EGD which showed no new bleeding. She was originally placed on ventilator for support, which was quickly weaned down to QHS as per her baseline.  Antibiotics d/c'd on the 13th. Blood cultures were positive for GNR on 11/14. Ceftaz was started for this, until sensitivities were received, for which she was started on Cipro. She remained on QHS vent support but had been weaning well during the day. Echo on 1/18 was negative for vegetation. She is deemed a candidate for discharge to vent SNF 1/18.     Discharge Plan by active problems   Chronic respiratory failure s/p tracheostomy with vent dependence at night. Hx of COPD. Janina Mayo collar/PMV as tolerated -Vent support QHS -Scheduled duoneb, pulmicort -F/u CXR as  needed  Hypotension 2nd to GI bleed >> resolved. Hx of HTN, chronic diastolic dysfx, PAF s/p MAZE, AS s/p AVR in October 2015. -Resume lovenox, ASA 1/19 per GI -Lipitor, amiodarone, coreg  Hypernatremia (resolved) -Continue free water   Upper GI bleed >> resolved. Dysphagia s/p PEG tube. -Tube feeds -Protonix BID  Acute on chronic anemia 2nd to GI bleed. -F/u CBC  GNR bacteremia, suspect GI translocation. TTE without evidence of vegetation. ?HCAP - minimal bibasilar opacities -Day 4/14 of Cipro -Follow up blood cx after ABX course  Hypothyroidism  Hx hyperglycemia -Continue synthroid -SSI  Depression. Deconditioning. -Zoloft -PT -ST  Significant Hospital tests/ studies   EGD 1/12 > no evidence of new bleeding BCx2 1/12 >> GNR >> Klebsiella, R to ceftaz  Consults  GI Discharge Exam: BP 123/61 mmHg  Pulse 93  Temp(Src) 98.2 F (36.8 C) (Oral)  Resp 23  Ht  (1.575 m)  Wt 60.5 kg (133 lb 6.1 oz)  BMI 24.39 kg/m2  SpO2 100%   General: Layng in bed, NAD Neuro: Alert, non-focal, normal strength HEENT: trach site clean, no JVD Cardiovascular: regular Lungs: no wheeze, comfortable on ATC Abdomen: PEG site clean Musculoskeletal: no edema Skin: no rashes  Labs at discharge Lab Results  Component Value Date   CREATININE 0.69 07/16/2014   BUN 18 07/16/2014   NA 131* 07/16/2014   K 4.2 07/16/2014   CL 95* 07/16/2014   CO2 32 07/16/2014   Lab Results  Component Value Date   WBC 8.3 07/16/2014   HGB 8.5* 07/16/2014   HCT 26.6*  07/16/2014   MCV 91.7 07/16/2014   PLT 137* 07/16/2014   Lab Results  Component Value Date   ALT 24 07/10/2014   AST 17 07/10/2014   ALKPHOS 79 07/10/2014   BILITOT 0.3 07/10/2014   Lab Results  Component Value Date   INR 1.07 07/13/2014   INR 1.02 07/10/2014    Current radiology studies Dg Chest Port 1 View  07/15/2014   CLINICAL DATA:  Subsequent encounter for shortness of breath. Endotracheal tube.  EXAM:  PORTABLE CHEST - 1 VIEW  COMPARISON:  07/10/2014  FINDINGS: Left PICC line terminates at mid to low SVC. Tracheostomy appropriately positioned. Prior median sternotomy. Cardiomegaly accentuated by AP portable technique. Probable small left pleural effusion. No pneumothorax. Slight worsening in bibasilar airspace disease. Mild interstitial edema.  IMPRESSION: Slight worsening aeration, with development of mild interstitial edema. Increased bibasilar opacities which could represent atelectasis or early infection.  Probable small left pleural effusion.   Electronically Signed   By: Jeronimo GreavesKyle  Talbot M.D.   On: 07/15/2014 07:58    Disposition:  Final discharge disposition not confirmed      Discharge Instructions    Increase activity slowly    Complete by:  As directed             Medication List    STOP taking these medications        acetaminophen 650 MG CR tablet  Commonly known as:  TYLENOL     acetaminophen 650 MG suppository  Commonly known as:  TYLENOL     bismuth subsalicylate 262 MG chewable tablet  Commonly known as:  PEPTO BISMOL     chlorhexidine 0.12 % solution  Commonly known as:  PERIDEX     lansoprazole 30 MG disintegrating tablet  Commonly known as:  PREVACID SOLUTAB      TAKE these medications        ALPRAZolam 0.25 MG tablet  Commonly known as:  XANAX  0.25 mg by PEG Tube route 2 (two) times daily.     amiodarone 200 MG tablet  Commonly known as:  PACERONE  200 mg by PEG Tube route 2 (two) times daily.     aspirin EC 81 MG tablet  81 mg by PEG Tube route daily.     atorvastatin 40 MG tablet  Commonly known as:  LIPITOR  40 mg by PEG Tube route daily.     budesonide 0.5 MG/2ML nebulizer solution  Commonly known as:  PULMICORT  0.5 mg by Tracheal Tube route every 12 (twelve) hours.     carvedilol 3.125 MG tablet  Commonly known as:  COREG  3.125 mg by PEG Tube route 2 (two) times daily with a meal.     ciprofloxacin 400 MG/200ML Soln  Commonly  known as:  CIPRO  Inject 200 mLs (400 mg total) into the vein 2 (two) times daily.     CULTURELLE PO  1 capsule by PEG Tube route 2 (two) times daily.     enoxaparin 40 MG/0.4ML injection  Commonly known as:  LOVENOX  Inject 40 mg into the skin daily.     free water Soln  Place 300 mLs into feeding tube every 4 (four) hours.     guaifenesin 400 MG Tabs tablet  Commonly known as:  HUMIBID E  400 mg by PEG Tube route 2 (two) times daily.     ipratropium-albuterol 0.5-2.5 (3) MG/3ML Soln  Commonly known as:  DUONEB  Take 3 mLs by nebulization every 6 (six) hours.  levothyroxine 200 MCG tablet  Commonly known as:  SYNTHROID, LEVOTHROID  Take 200 mcg by mouth daily before breakfast.     NOVOLOG FLEXPEN 100 UNIT/ML FlexPen  Generic drug:  insulin aspart  - Inject 1-12 Units into the skin every 6 (six) hours. 111-200=1 units  - 151-200=2 units  - 201-250=4 units  - 251-300=6 units  - 301-350=8 units  - 351-400=10 units  - 401-450=12 units or greater     NUTREN 2.0 PO  240 mLs by Pump Prime route every 6 (six) hours. 60ml/hr     ondansetron 4 MG tablet  Commonly known as:  ZOFRAN  4 mg by PEG Tube route every 6 (six) hours as needed for nausea or vomiting.     oxyCODONE 5 MG immediate release tablet  Commonly known as:  Oxy IR/ROXICODONE  5 mg by PEG Tube route every 6 (six) hours as needed for moderate pain or severe pain.     pantoprazole sodium 40 mg/20 mL Pack  Commonly known as:  PROTONIX  Place 20 mLs (40 mg total) into feeding tube 2 (two) times daily.     predniSONE 5 MG tablet  Commonly known as:  DELTASONE  Take 5 mg by mouth daily with breakfast.     PROTEIN PO  Give 74 mLs by tube daily.     sertraline 100 MG tablet  Commonly known as:  ZOLOFT  100 mg by PEG Tube route daily.     Vitamin D3 2000 UNITS Tabs  2,000 Units by PEG Tube route daily.         Discharged Condition: fair  Joneen Roach, AGACNP-BC [facility] Pulmonology/Critical  Care Pager [phone] or [phone]     Coralyn Helling, MD [facility] Pulmonary/Critical Care 07/16/2014, 5:45 PM Pager:  [phone] After 3pm call: [phone]

## 2014-07-16 NOTE — Progress Notes (Signed)
Physical Therapy Treatment Patient Details Name: Samantha Hubbard MRN: [id number] DOB: Nov 17, 1934 Today's Date: 07/16/2014    History of Present Illness 80 year old woman with history of ventilator dependent respiratory failure with trach, dysphagia s/p PEG, diastolic CHF, COPD, chronic anemia, hypothyroidism, HTN, PAF s/p MAZE, aortic stenosis s/p aortic valve replacement, CKD, presenting from [facility] to [facility] ED with GI bleed.    PT Comments    Pt in chair on arrival. Pt got up with 3 person assist from night shift but tolerating trach collar throughout mobility with sats 97-99% on 40% FiO2 and HR 105. Pt continues to state dizziness but no drop in BP with positional changes. Pt educated for transfers, need for increased activity and HEp. Pt encouraged and reassured throughout and may benefit from premedication with anxiety medicine prior to therapy to maximize activity tolerance. Will continue to follow and recommend OOB daily.   Follow Up Recommendations  LTACH;Supervision/Assistance - 24 hour     Equipment Recommendations       Recommendations for Other Services       Precautions / Restrictions Precautions Precautions: Fall Precaution Comments: trach, vent, PEG    Mobility  Bed Mobility Overal bed mobility: Needs Assistance Bed Mobility: Sit to Supine       Sit to supine: Mod assist   General bed mobility comments: assist to bring legs onto surface, achieve midline with cues for sequence   Transfers Overall transfer level: Needs assistance   Transfers: Sit to/from Stand;Stand Pivot Transfers Sit to Stand: +2 physical assistance;+2 safety/equipment;Max assist Stand pivot transfers: Max assist;+2 physical assistance;+2 safety/equipment       General transfer comment: cues for hand placement, anterior translation and safety. Pt stood from chair x 2 with first trial grossly 45 sec with cues for hand placement. 2nd trial stood and pivoted with RW to bed with max  cues and assist for hip extension, position in RW, stepping and hand placement  Ambulation/Gait                 Stairs            Wheelchair Mobility    Modified Rankin (Stroke Patients Only)       Balance Overall balance assessment: Needs assistance   Sitting balance-Leahy Scale: Fair       Standing balance-Leahy Scale: Poor                      Cognition Arousal/Alertness: Awake/alert Behavior During Therapy: WFL for tasks assessed/performed;Anxious Overall Cognitive Status: Within Functional Limits for tasks assessed                      Exercises      General Comments        Pertinent Vitals/Pain Pain Score: 4  Pain Location: chronic back pain Pain Descriptors / Indicators: Aching Pain Intervention(s): Repositioned    Home Living                      Prior Function            PT Goals (current goals can now be found in the care plan section) Progress towards PT goals: Progressing toward goals    Frequency       PT Plan Current plan remains appropriate    Co-evaluation PT/OT/SLP Co-Evaluation/Treatment: Yes Reason for Co-Treatment: Complexity of the patient's impairments (multi-system involvement) PT goals addressed during session: Mobility/safety with mobility;Balance;Proper use of DME  End of Session   Activity Tolerance: Patient limited by fatigue Patient left: in bed;with call bell/phone within reach;with nursing/sitter in room     Time: 0942-1012 PT Time Calculation (min) (ACUTE ONLY): 30 min  Charges:  $Therapeutic Activity: 8-22 mins                    G Codes:      Delorse Lekabor, Alonie Gazzola Beth 07/16/2014, 10:18 AM Delaney MeigsMaija Tabor Audrina Marten, PT [phone]

## 2014-07-16 NOTE — Progress Notes (Signed)
Pt placed on 40% ATC. RT will continue to monitor

## 2014-07-16 NOTE — Progress Notes (Signed)
Echocardiogram 2D Echocardiogram has been performed.  Jamaya Sleeth 07/16/2014, 2:01 PM

## 2014-07-17 NOTE — Clinical Social Work Note (Signed)
Patient to be d/c'ed today to [facility].  Patient and family agreeable to plans will transport via [facility] RN to call report.  Windell MouldingEric Ashlynn Gunnels, MSW, Theresia MajorsLCSWA [phone]

## 2014-07-19 LAB — CULTURE, BLOOD (ROUTINE X 2)

## 2014-07-22 LAB — CULTURE, BLOOD (ROUTINE X 2): CULTURE: NO GROWTH

## 2014-08-03 ENCOUNTER — Encounter (HOSPITAL_COMMUNITY): Payer: Self-pay | Admitting: Emergency Medicine

## 2014-08-03 ENCOUNTER — Inpatient Hospital Stay (HOSPITAL_COMMUNITY)
Admission: EM | Admit: 2014-08-03 | Discharge: 2014-08-08 | DRG: 378 | Disposition: A | Discharge status: Other Acute Inpatient Hospital | Payer: Medicare Other | Attending: Internal Medicine | Admitting: Internal Medicine

## 2014-08-03 DIAGNOSIS — Z7901 Long term (current) use of anticoagulants: Secondary | ICD-10-CM

## 2014-08-03 DIAGNOSIS — J9610 Chronic respiratory failure, unspecified whether with hypoxia or hypercapnia: Secondary | ICD-10-CM | POA: Diagnosis not present

## 2014-08-03 DIAGNOSIS — Z931 Gastrostomy status: Secondary | ICD-10-CM | POA: Diagnosis not present

## 2014-08-03 DIAGNOSIS — K921 Melena: Principal | ICD-10-CM | POA: Diagnosis present

## 2014-08-03 DIAGNOSIS — K625 Hemorrhage of anus and rectum: Secondary | ICD-10-CM

## 2014-08-03 DIAGNOSIS — Z87891 Personal history of nicotine dependence: Secondary | ICD-10-CM | POA: Diagnosis not present

## 2014-08-03 DIAGNOSIS — Z953 Presence of xenogenic heart valve: Secondary | ICD-10-CM

## 2014-08-03 DIAGNOSIS — I5032 Chronic diastolic (congestive) heart failure: Secondary | ICD-10-CM | POA: Diagnosis not present

## 2014-08-03 DIAGNOSIS — Z79899 Other long term (current) drug therapy: Secondary | ICD-10-CM | POA: Diagnosis not present

## 2014-08-03 DIAGNOSIS — Z7982 Long term (current) use of aspirin: Secondary | ICD-10-CM

## 2014-08-03 DIAGNOSIS — E2740 Unspecified adrenocortical insufficiency: Secondary | ICD-10-CM | POA: Diagnosis present

## 2014-08-03 DIAGNOSIS — I10 Essential (primary) hypertension: Secondary | ICD-10-CM | POA: Diagnosis not present

## 2014-08-03 DIAGNOSIS — J439 Emphysema, unspecified: Secondary | ICD-10-CM | POA: Diagnosis present

## 2014-08-03 DIAGNOSIS — Z930 Tracheostomy status: Secondary | ICD-10-CM | POA: Diagnosis not present

## 2014-08-03 DIAGNOSIS — K922 Gastrointestinal hemorrhage, unspecified: Secondary | ICD-10-CM | POA: Diagnosis present

## 2014-08-03 DIAGNOSIS — Z9911 Dependence on respirator [ventilator] status: Secondary | ICD-10-CM | POA: Diagnosis not present

## 2014-08-03 DIAGNOSIS — Z794 Long term (current) use of insulin: Secondary | ICD-10-CM

## 2014-08-03 DIAGNOSIS — E039 Hypothyroidism, unspecified: Secondary | ICD-10-CM | POA: Diagnosis present

## 2014-08-03 DIAGNOSIS — I480 Paroxysmal atrial fibrillation: Secondary | ICD-10-CM | POA: Diagnosis not present

## 2014-08-03 DIAGNOSIS — K5790 Diverticulosis of intestine, part unspecified, without perforation or abscess without bleeding: Secondary | ICD-10-CM | POA: Diagnosis not present

## 2014-08-03 DIAGNOSIS — E876 Hypokalemia: Secondary | ICD-10-CM | POA: Diagnosis not present

## 2014-08-03 DIAGNOSIS — D500 Iron deficiency anemia secondary to blood loss (chronic): Secondary | ICD-10-CM | POA: Diagnosis not present

## 2014-08-03 DIAGNOSIS — F329 Major depressive disorder, single episode, unspecified: Secondary | ICD-10-CM | POA: Diagnosis not present

## 2014-08-03 DIAGNOSIS — Z7952 Long term (current) use of systemic steroids: Secondary | ICD-10-CM

## 2014-08-03 DIAGNOSIS — E785 Hyperlipidemia, unspecified: Secondary | ICD-10-CM | POA: Diagnosis not present

## 2014-08-03 DIAGNOSIS — E871 Hypo-osmolality and hyponatremia: Secondary | ICD-10-CM

## 2014-08-03 DIAGNOSIS — E222 Syndrome of inappropriate secretion of antidiuretic hormone: Secondary | ICD-10-CM | POA: Diagnosis present

## 2014-08-03 DIAGNOSIS — E119 Type 2 diabetes mellitus without complications: Secondary | ICD-10-CM | POA: Diagnosis not present

## 2014-08-03 DIAGNOSIS — F419 Anxiety disorder, unspecified: Secondary | ICD-10-CM | POA: Diagnosis present

## 2014-08-03 DIAGNOSIS — I9589 Other hypotension: Secondary | ICD-10-CM

## 2014-08-03 DIAGNOSIS — D62 Acute posthemorrhagic anemia: Secondary | ICD-10-CM | POA: Diagnosis present

## 2014-08-03 DIAGNOSIS — Z7951 Long term (current) use of inhaled steroids: Secondary | ICD-10-CM | POA: Diagnosis not present

## 2014-08-03 DIAGNOSIS — K5520 Angiodysplasia of colon without hemorrhage: Secondary | ICD-10-CM | POA: Diagnosis present

## 2014-08-03 DIAGNOSIS — J9612 Chronic respiratory failure with hypercapnia: Secondary | ICD-10-CM

## 2014-08-03 DIAGNOSIS — D649 Anemia, unspecified: Secondary | ICD-10-CM

## 2014-08-03 DIAGNOSIS — E46 Unspecified protein-calorie malnutrition: Secondary | ICD-10-CM | POA: Diagnosis present

## 2014-08-03 DIAGNOSIS — I959 Hypotension, unspecified: Secondary | ICD-10-CM | POA: Diagnosis not present

## 2014-08-03 HISTORY — DX: Other specified anxiety disorders: F41.8

## 2014-08-03 HISTORY — DX: Acute kidney failure, unspecified: N17.9

## 2014-08-03 LAB — BASIC METABOLIC PANEL
Anion gap: 6 (ref 5–15)
Anion gap: 5 (ref 5–15)
BUN: 30 mg/dL — ABNORMAL HIGH (ref 6–23)
BUN: 28 mg/dL — ABNORMAL HIGH (ref 6–23)
CALCIUM: 9.2 mg/dL (ref 8.4–10.5)
CHLORIDE: 91 mmol/L — AB (ref 96–112)
CO2: 30 mmol/L (ref 19–32)
CO2: 31 mmol/L (ref 19–32)
CREATININE: 0.76 mg/dL (ref 0.50–1.10)
Calcium: 8.9 mg/dL (ref 8.4–10.5)
Chloride: 91 mmol/L — ABNORMAL LOW (ref 96–112)
Creatinine, Ser: 0.78 mg/dL (ref 0.50–1.10)
GFR calc Af Amer: >90 mL/min (ref >90)
GFR calc Af Amer: >90 mL/min (ref >90)
GFR calc non Af Amer: 79 mL/min — ABNORMAL LOW (ref >90)
GFR, EST NON AFRICAN AMERICAN: 79 mL/min — AB (ref >90)
Glucose, Bld: 233 mg/dL — ABNORMAL HIGH (ref 70–99)
Glucose, Bld: 110 mg/dL — ABNORMAL HIGH (ref 70–99)
POTASSIUM: 4.7 mmol/L (ref 3.5–5.1)
Potassium: 4.9 mmol/L (ref 3.5–5.1)
Sodium: 127 mmol/L — ABNORMAL LOW (ref 135–145)
Sodium: 127 mmol/L — ABNORMAL LOW (ref 135–145)

## 2014-08-03 LAB — CBC WITH DIFFERENTIAL/PLATELET
BASOS ABS: 0 10*3/uL (ref 0.0–0.1)
BASOS PCT: 0 % (ref 0–1)
EOS PCT: 0 % (ref 0–5)
Eosinophils Absolute: 0 10*3/uL (ref 0.0–0.7)
HCT: 22.8 % — ABNORMAL LOW (ref 36.0–46.0)
Hemoglobin: 7.2 g/dL — ABNORMAL LOW (ref 12.0–15.0)
LYMPHS ABS: 1 10*3/uL (ref 0.7–4.0)
LYMPHS PCT: 8 % — AB (ref 12–46)
MCH: 28.9 pg (ref 26.0–34.0)
MCHC: 31.6 g/dL (ref 30.0–36.0)
MCV: 91.6 fL (ref 78.0–100.0)
Monocytes Absolute: 0.7 10*3/uL (ref 0.1–1.0)
Monocytes Relative: 6 % (ref 3–12)
NEUTROS ABS: 10.2 10*3/uL — AB (ref 1.7–7.7)
Neutrophils Relative %: 86 % — ABNORMAL HIGH (ref 43–77)
Platelets: 212 10*3/uL (ref 150–400)
RBC: 2.49 MIL/uL — ABNORMAL LOW (ref 3.87–5.11)
RDW: 16.9 % — ABNORMAL HIGH (ref 11.5–15.5)
WBC: 11.9 10*3/uL — ABNORMAL HIGH (ref 4.0–10.5)

## 2014-08-03 LAB — CBC
HCT: 19.9 % — ABNORMAL LOW (ref 36.0–46.0)
HEMOGLOBIN: 6.5 g/dL — AB (ref 12.0–15.0)
MCH: 29.4 pg (ref 26.0–34.0)
MCHC: 32.7 g/dL (ref 30.0–36.0)
MCV: 90 fL (ref 78.0–100.0)
PLATELETS: 211 10*3/uL (ref 150–400)
RBC: 2.21 MIL/uL — ABNORMAL LOW (ref 3.87–5.11)
RDW: 17 % — AB (ref 11.5–15.5)
WBC: 11.5 10*3/uL — AB (ref 4.0–10.5)

## 2014-08-03 LAB — POC OCCULT BLOOD, ED: FECAL OCCULT BLD: POSITIVE — AB

## 2014-08-03 LAB — LACTIC ACID, PLASMA: Lactic Acid, Venous: 1.8 mmol/L (ref 0.5–2.0)

## 2014-08-03 LAB — GLUCOSE, CAPILLARY
GLUCOSE-CAPILLARY: 98 mg/dL (ref 70–99)
Glucose-Capillary: 180 mg/dL — ABNORMAL HIGH (ref 70–99)
Glucose-Capillary: 137 mg/dL — ABNORMAL HIGH (ref 70–99)

## 2014-08-03 LAB — PREPARE RBC (CROSSMATCH)

## 2014-08-03 LAB — PROTIME-INR
INR: 1.06 (ref 0.00–1.49)
Prothrombin Time: 13.9 seconds (ref 11.6–15.2)

## 2014-08-03 MED ORDER — SODIUM CHLORIDE 0.9 % IV SOLN: 250.0000 mL | INTRAVENOUS | Status: DC | PRN

## 2014-08-03 MED ORDER — SODIUM CHLORIDE 0.9 % IV SOLN
Freq: Once | INTRAVENOUS | Status: AC
  Administered 2014-08-03: 19:00:00 via INTRAVENOUS

## 2014-08-03 MED ORDER — PEG-KCL-NACL-NASULF-NA ASC-C 100 G PO SOLR: 1.0000 | Freq: Once | ORAL | Status: DC

## 2014-08-03 MED ORDER — SERTRALINE HCL 100 MG PO TABS
100.0000 mg | ORAL_TABLET | Freq: Every day | ORAL | Status: DC
  Administered 2014-08-03 – 2014-08-08 (×6): 100 mg
  Filled 2014-08-03 (×6): qty 1

## 2014-08-03 MED ORDER — VITAMIN D 1000 UNITS PO TABS
2000.0000 [IU] | ORAL_TABLET | Freq: Every day | ORAL | Status: DC
Start: 2014-08-03 — End: 2014-08-08
  Administered 2014-08-03 – 2014-08-08 (×6): 2000 [IU] via ORAL
  Filled 2014-08-03 (×6): qty 2

## 2014-08-03 MED ORDER — ATORVASTATIN CALCIUM 40 MG PO TABS
40.0000 mg | ORAL_TABLET | Freq: Every day | ORAL | Status: DC
Start: 2014-08-03 — End: 2014-08-08
  Administered 2014-08-03 – 2014-08-08 (×6): 40 mg
  Filled 2014-08-03 (×6): qty 1

## 2014-08-03 MED ORDER — HYDROCORTISONE NA SUCCINATE PF 100 MG IJ SOLR
50.0000 mg | Freq: Two times a day (BID) | INTRAMUSCULAR | Status: DC
  Administered 2014-08-03 – 2014-08-04 (×2): 50 mg via INTRAVENOUS
  Filled 2014-08-03 (×4): qty 1

## 2014-08-03 MED ORDER — CHLORHEXIDINE GLUCONATE 0.12 % MT SOLN: 15.0000 mL | Freq: Two times a day (BID) | OROMUCOSAL | Status: DC

## 2014-08-03 MED ORDER — ALPRAZOLAM 0.25 MG PO TABS
0.2500 mg | ORAL_TABLET | Freq: Two times a day (BID) | ORAL | Status: DC
  Administered 2014-08-03 – 2014-08-08 (×8): 0.25 mg
  Filled 2014-08-03 (×8): qty 1

## 2014-08-03 MED ORDER — CARVEDILOL 3.125 MG PO TABS
3.1250 mg | ORAL_TABLET | Freq: Two times a day (BID) | ORAL | Status: DC
  Administered 2014-08-03: 3.125 mg
  Filled 2014-08-03 (×4): qty 1

## 2014-08-03 MED ORDER — SODIUM CHLORIDE 0.9 % IV SOLN
INTRAVENOUS | Status: DC
Start: 2014-08-03 — End: 2014-08-03

## 2014-08-03 MED ORDER — LEVOTHYROXINE SODIUM 200 MCG PO TABS
200.0000 ug | ORAL_TABLET | Freq: Every day | ORAL | Status: DC
  Administered 2014-08-05 – 2014-08-08 (×4): 200 ug
  Filled 2014-08-03 (×6): qty 1

## 2014-08-03 MED ORDER — PEG-KCL-NACL-NASULF-NA ASC-C 100 G PO SOLR
0.5000 | Freq: Once | ORAL | Status: AC
  Administered 2014-08-03: 100 g
  Filled 2014-08-03: qty 1

## 2014-08-03 MED ORDER — IPRATROPIUM-ALBUTEROL 0.5-2.5 (3) MG/3ML IN SOLN
3.0000 mL | Freq: Four times a day (QID) | RESPIRATORY_TRACT | Status: DC
  Administered 2014-08-03 (×2): 3 mL via RESPIRATORY_TRACT
  Filled 2014-08-03 (×3): qty 3

## 2014-08-03 MED ORDER — BIOTENE DRY MOUTH MT LIQD
15.0000 mL | Freq: Four times a day (QID) | OROMUCOSAL | Status: DC
  Administered 2014-08-04 – 2014-08-08 (×20): 15 mL via OROMUCOSAL

## 2014-08-03 MED ORDER — BUDESONIDE 0.5 MG/2ML IN SUSP
0.5000 mg | Freq: Two times a day (BID) | RESPIRATORY_TRACT | Status: DC
Start: 2014-08-03 — End: 2014-08-08
  Administered 2014-08-03 – 2014-08-08 (×9): 0.5 mg via RESPIRATORY_TRACT
  Filled 2014-08-03 (×14): qty 2

## 2014-08-03 MED ORDER — IPRATROPIUM-ALBUTEROL 0.5-2.5 (3) MG/3ML IN SOLN
3.0000 mL | Freq: Four times a day (QID) | RESPIRATORY_TRACT | Status: DC
  Administered 2014-08-03 – 2014-08-04 (×4): 3 mL via RESPIRATORY_TRACT
  Filled 2014-08-03 (×2): qty 3

## 2014-08-03 MED ORDER — SODIUM CHLORIDE 0.9 % IV SOLN
INTRAVENOUS | Status: DC
  Administered 2014-08-03: 14:00:00 via INTRAVENOUS
  Administered 2014-08-04: 50 mL/h via INTRAVENOUS
  Administered 2014-08-04: 1000 mL via INTRAVENOUS
  Administered 2014-08-05: 22:00:00 via INTRAVENOUS

## 2014-08-03 MED ORDER — ACETAMINOPHEN 325 MG PO TABS
650.0000 mg | ORAL_TABLET | ORAL | Status: DC | PRN
  Administered 2014-08-03 – 2014-08-05 (×2): 650 mg
  Filled 2014-08-03 (×2): qty 2

## 2014-08-03 MED ORDER — TRAZODONE HCL 50 MG PO TABS
50.0000 mg | ORAL_TABLET | Freq: Every evening | ORAL | Status: DC | PRN
  Administered 2014-08-06 – 2014-08-07 (×2): 50 mg via ORAL
  Filled 2014-08-03 (×4): qty 1

## 2014-08-03 MED ORDER — CHLORHEXIDINE GLUCONATE 0.12 % MT SOLN
15.0000 mL | Freq: Two times a day (BID) | OROMUCOSAL | Status: DC
  Administered 2014-08-03 – 2014-08-08 (×10): 15 mL via OROMUCOSAL
  Filled 2014-08-03 (×9): qty 15

## 2014-08-03 MED ORDER — METOCLOPRAMIDE HCL 5 MG/ML IJ SOLN
10.0000 mg | Freq: Once | INTRAMUSCULAR | Status: AC
  Administered 2014-08-04: 10 mg via INTRAVENOUS
  Filled 2014-08-03 (×2): qty 2

## 2014-08-03 MED ORDER — INSULIN ASPART 100 UNIT/ML ~~LOC~~ SOLN
0.0000 [IU] | SUBCUTANEOUS | Status: DC
  Administered 2014-08-03: 3 [IU] via SUBCUTANEOUS
  Administered 2014-08-03: 2 [IU] via SUBCUTANEOUS
  Administered 2014-08-05: 3 [IU] via SUBCUTANEOUS
  Administered 2014-08-05: 2 [IU] via SUBCUTANEOUS
  Administered 2014-08-05: 3 [IU] via SUBCUTANEOUS
  Administered 2014-08-05 (×2): 2 [IU] via SUBCUTANEOUS
  Administered 2014-08-05 – 2014-08-06 (×3): 3 [IU] via SUBCUTANEOUS
  Administered 2014-08-06: 2 [IU] via SUBCUTANEOUS
  Administered 2014-08-06: 3 [IU] via SUBCUTANEOUS
  Administered 2014-08-06: 2 [IU] via SUBCUTANEOUS
  Administered 2014-08-07: 3 [IU] via SUBCUTANEOUS
  Administered 2014-08-07 (×2): 2 [IU] via SUBCUTANEOUS
  Administered 2014-08-07: 5 [IU] via SUBCUTANEOUS
  Administered 2014-08-08 (×2): 2 [IU] via SUBCUTANEOUS
  Administered 2014-08-08: 3 [IU] via SUBCUTANEOUS
  Administered 2014-08-08: 2 [IU] via SUBCUTANEOUS

## 2014-08-03 MED ORDER — AMIODARONE HCL 200 MG PO TABS
200.0000 mg | ORAL_TABLET | Freq: Two times a day (BID) | ORAL | Status: DC
  Administered 2014-08-03 – 2014-08-08 (×10): 200 mg
  Filled 2014-08-03 (×12): qty 1

## 2014-08-03 MED ORDER — ONDANSETRON HCL 4 MG PO TABS
4.0000 mg | ORAL_TABLET | Freq: Four times a day (QID) | ORAL | Status: DC | PRN
  Administered 2014-08-04: 4 mg
  Filled 2014-08-03: qty 1

## 2014-08-03 MED ORDER — PANTOPRAZOLE SODIUM 40 MG IV SOLR
40.0000 mg | Freq: Two times a day (BID) | INTRAVENOUS | Status: DC
  Administered 2014-08-03 – 2014-08-05 (×4): 40 mg via INTRAVENOUS
  Filled 2014-08-03 (×6): qty 40

## 2014-08-03 MED ORDER — PEG-KCL-NACL-NASULF-NA ASC-C 100 G PO SOLR
0.5000 | Freq: Once | ORAL | Status: AC
  Administered 2014-08-04: 100 g
  Filled 2014-08-03: qty 1

## 2014-08-03 MED ORDER — METOCLOPRAMIDE HCL 5 MG/ML IJ SOLN
10.0000 mg | Freq: Once | INTRAMUSCULAR | Status: AC
  Administered 2014-08-03: 10 mg via INTRAVENOUS
  Filled 2014-08-03: qty 2

## 2014-08-03 NOTE — ED Provider Notes (Signed)
CSN: [id number]     Arrival date & time 08/03/14  0524 History   First MD Initiated Contact with Patient 08/03/14 0530     Chief Complaint  Patient presents with  . GI Bleeding   LEVEL 5 CAVEAT DUE TO ACUITY OF CONDITION  Patient is a 80 y.o. female presenting with hematochezia. The history is provided by the patient. The history is limited by the condition of the patient.  Rectal Bleeding Quality:  Bright red Amount:  Moderate Timing:  Intermittent Progression:  Worsening Chronicity:  Recurrent Relieved by:  None tried Worsened by:  Nothing tried Associated symptoms: no fever   Patient presents from long term nursing facility with 4 episodes of bloody stool She has trach in place as well PEG tube and foley catheter in on arrival  No other details are known at time of arrival Patient has difficulty communicating at baseline due to trach in place  Past Medical History  Diagnosis Date  . Acute respiratory failure with hypoxia and hypercapnia   . Ventilator dependent   . Healthcare-associated pneumonia   . Pneumonia, Klebsiella   . Left ventricular diastolic dysfunction   . Chronic pain syndrome   . Critical illness myopathy   . Severe protein-calorie malnutrition   . Dysphagia   . COPD (chronic obstructive pulmonary disease)   . Anemia of chronic disease   . Thyroid disease     Hypothyroidism  . Depression   . Anxiety   . Hypertension   . Thrush   . Hypernatremia   . Paroxysmal a-fib 10/15    status post Maze   . Aortic stenosis 10/15    status post AV replacement   . Electrolyte imbalance   . Renal disorder   . History of hemodialysis   . Hyperglycemia   . Encounter for wound care   . Insomnia   . Peripheral vascular disease   . Generalized weakness   . Chronic low back pain   . Emphysema of lung   . Hyperlipemia   . Allergic rhinitis    Past Surgical History  Procedure Laterality Date  . Esophagogastroduodenoscopy N/A 07/10/2014    Procedure:  ESOPHAGOGASTRODUODENOSCOPY (EGD);  Surgeon: Louis Meckel, MD;  Location: [facility] ENDOSCOPY;  Service: Endoscopy;  Laterality: N/A;   History reviewed. No pertinent family history. History  Substance Use Topics  . Smoking status: Former Smoker -- 1.00 packs/day for 50 years    Types: Cigarettes  . Smokeless tobacco: Never Used  . Alcohol Use: No   OB History    No data available     Review of Systems  Unable to perform ROS: Acuity of condition  Constitutional: Negative for fever.  Gastrointestinal: Positive for hematochezia.      Allergies  Zyrtec; Augmentin; Bactrim; Neo-synephrine ; Neo-synephrine 12 hour spray; and Sudafed  Home Medications   Prior to Admission medications   Medication Sig Start Date End Date Taking? Authorizing Provider  ALPRAZolam (XANAX) 0.25 MG tablet 0.25 mg by PEG Tube route 2 (two) times daily.    Historical Provider, MD  amiodarone (PACERONE) 200 MG tablet 200 mg by PEG Tube route 2 (two) times daily.    Historical Provider, MD  aspirin EC 81 MG tablet 81 mg by PEG Tube route daily.    Historical Provider, MD  atorvastatin (LIPITOR) 40 MG tablet 40 mg by PEG Tube route daily.    Historical Provider, MD  budesonide (PULMICORT) 0.5 MG/2ML nebulizer solution 0.5 mg by Tracheal Tube route every  12 (twelve) hours.    Historical Provider, MD  carvedilol (COREG) 3.125 MG tablet 3.125 mg by PEG Tube route 2 (two) times daily with a meal.    Historical Provider, MD  Cholecalciferol (VITAMIN D3) 2000 UNITS TABS 2,000 Units by PEG Tube route daily.    Historical Provider, MD  enoxaparin (LOVENOX) 40 MG/0.4ML injection Inject 40 mg into the skin daily.    Historical Provider, MD  guaifenesin (HUMIBID E) 400 MG TABS tablet 400 mg by PEG Tube route 2 (two) times daily.    Historical Provider, MD  insulin aspart (NOVOLOG FLEXPEN) 100 UNIT/ML FlexPen Inject 1-12 Units into the skin every 6 (six) hours. 111-200=1 units 151-200=2 units 201-250=4 units 251-300=6  units 301-350=8 units 351-400=10 units 401-450=12 units or greater    Historical Provider, MD  ipratropium-albuterol (DUONEB) 0.5-2.5 (3) MG/3ML SOLN Take 3 mLs by nebulization every 6 (six) hours.    Historical Provider, MD  Lactobacillus Rhamnosus, GG, (CULTURELLE PO) 1 capsule by PEG Tube route 2 (two) times daily.    Historical Provider, MD  levothyroxine (SYNTHROID, LEVOTHROID) 200 MCG tablet Take 200 mcg by mouth daily before breakfast.    Historical Provider, MD  Nutritional Supplements (NUTREN 2.0 PO) 240 mLs by Pump Prime route every 6 (six) hours. 6640ml/hr    Historical Provider, MD  ondansetron (ZOFRAN) 4 MG tablet 4 mg by PEG Tube route every 6 (six) hours as needed for nausea or vomiting.    Historical Provider, MD  oxyCODONE (OXY IR/ROXICODONE) 5 MG immediate release tablet 5 mg by PEG Tube route every 6 (six) hours as needed for moderate pain or severe pain.    Historical Provider, MD  pantoprazole sodium (PROTONIX) 40 mg/20 mL PACK Place 20 mLs (40 mg total) into feeding tube 2 (two) times daily. 07/16/14   Duayne CalPaul W Hoffman, NP  predniSONE (DELTASONE) 5 MG tablet Take 5 mg by mouth daily with breakfast.    Historical Provider, MD  PROTEIN PO Give 74 mLs by tube daily.    Historical Provider, MD  sertraline (ZOLOFT) 100 MG tablet 100 mg by PEG Tube route daily.    Historical Provider, MD  Water For Irrigation, Sterile (FREE WATER) SOLN Place 300 mLs into feeding tube every 4 (four) hours. 07/16/14   Duayne CalPaul W Hoffman, NP   BP 106/54 mmHg  Pulse 86  Temp(Src) 98.7 F (37.1 C) (Oral)  Resp 23  SpO2 99% Physical Exam CONSTITUTIONAL:frail, elderly HEAD: Normocephalic/atraumatic EYES: EOMI/PERRL ENMT: Mucous membranes moist NECK: supple no meningeal signs, trach in place, no active bleeding SPINE/BACK:entire spine nontender CV: S1/S2 noted LUNGS: Lungs are clear to auscultation bilaterally, no apparent distress. Pt is on ventilator ABDOMEN: soft, nontender, no rebound or guarding,  bowel sounds noted throughout abdomen. PEG tube in place.  No bleeding/drainage noted around PEG.   WU:[id number]:foley catheter in place on arrival Rectal - bloody stool noted NEURO: Pt is awake/alert, moves all extremitiesx4.   EXTREMITIES: pulses normal/equal, full ROM SKIN: warm, color normal PSYCH: unable to assess  ED Course  Procedures   5:57 AM Pt seen on arrival for acute GI bleed Recent admission for UGI bleed but had EGD that did not reveal source Will follow closely 6:52 AM Pt awake/alert Vitals appropriate Son at bedside and he was updated on plan Will call for admission Per MAR, pt is on lovenox at this time.  She has h/o AV replacement previously 7:22 AM D/w dr Kendrick Friesmcquaid with critical care Will admit to ICU due to chronic vent  dependent as well as acute GI bleed PRBC has been ordered for patient but not transfused as of yet BP 109/55 mmHg  Pulse 86  Temp(Src) 98.7 F (37.1 C) (Oral)  Resp 20  SpO2 99%  Labs Review Labs Reviewed  BASIC METABOLIC PANEL - Abnormal; Notable for the following:    Sodium 127 (*)    Chloride 91 (*)    Glucose, Bld 233 (*)    BUN 30 (*)    GFR calc non Af Amer 79 (*)    All other components within normal limits  CBC WITH DIFFERENTIAL/PLATELET - Abnormal; Notable for the following:    WBC 11.9 (*)    RBC 2.49 (*)    Hemoglobin 7.2 (*)    HCT 22.8 (*)    RDW 16.9 (*)    Neutrophils Relative % 86 (*)    Neutro Abs 10.2 (*)    Lymphocytes Relative 8 (*)    All other components within normal limits  POC OCCULT BLOOD, ED - Abnormal; Notable for the following:    Fecal Occult Bld POSITIVE (*)    All other components within normal limits  CLOSTRIDIUM DIFFICILE BY PCR  PROTIME-INR  TYPE AND SCREEN  PREPARE RBC (CROSSMATCH)     MDM   Final diagnoses:  Rectal bleeding  Anemia, unspecified anemia type  Hyponatremia    Nursing notes including past medical history and social history reviewed and considered in documentation Labs/vital  reviewed myself and considered during evaluation     Joya Gaskins, MD 08/03/14 [phone]

## 2014-08-03 NOTE — ED Notes (Signed)
Pt is from [facility]. Per [facility], the pt has had 4 bloody stools over night, with hx of the same. Pt has a trach (#6 Shiley), PEG tube and foley. Pt would also like us to call her son, but not until later.

## 2014-08-03 NOTE — ED Notes (Signed)
Per pt, what this RN thought was a pressure ulcer has been diagnosed as shingles, which she has finished treatment for. Per [facility], the nurse was unsure of the date the pt's foley was changed and when the IV was started. This RN found the insertion date of the foley on the bag - 07/10/14. The nurse at [facility], Priscilla, states that the IV is at least 544 days old. Also, per family, the pt should have a PICC line in the left arm, but there is no PICC present on assessment. Pt's PEG tube is intact, and the dressing was last changed 08/02/14.

## 2014-08-03 NOTE — H&P (Signed)
PULMONARY / CRITICAL CARE MEDICINE   Name: Samantha Hubbard MRN: [id number] DOB: Apr 12, 1935    ADMISSION DATE:  08/03/2014 CONSULTATION DATE:  08/03/2014  REFERRING MD :  Bebe Shaggy  CHIEF COMPLAINT:  LGIB  INITIAL PRESENTATION: 80 year old female with chronic respiratory failure post valve replacement in 2015 admitted on 08/03/2014 to [facility] with a lower GI bleed.  STUDIES:    SIGNIFICANT EVENTS:   HISTORY OF PRESENT ILLNESS:  This is a 80 year old female who underwent a maze procedure and aortic valve replacement in the fall of 2015 and had postoperative course complicated by ventilator-dependent respiratory failure who comes to [facility] on 08/03/2014 from [facility] with a lower GI bleed. She had been hospitalized in December 2015 through January 2016 with a GI bleed and gram-negative rod bacteremia. During that time she had an endoscopy which showed chronic gastritis. She had been treated previously with full dose anticoagulation because of paroxysmal A. fib. She was discharged to [facility] on 07/16/2014. Apparently she did well initially and by January 29 she had weaned off the ventilator temporarily and was able to use a Passy-Muir valve and drink some fluids. She had been participating in physical therapy and had been up to a chair. However, on 08/03/2013 she developed the sudden onset of lower GI bleeding. Apparently she had 4 bloody stools at [facility]. In the [facility] ED she has been hemostatically stable and her hemoglobin level is 7.6. She is complaining of very mild pain in her left lower quadrant.  PAST MEDICAL HISTORY :   has a past medical history of Acute respiratory failure with hypoxia and hypercapnia; Ventilator dependent; Healthcare-associated pneumonia; Pneumonia, Klebsiella; Left ventricular diastolic dysfunction; Chronic pain syndrome; Critical illness myopathy; Severe protein-calorie malnutrition; Dysphagia; COPD (chronic obstructive  pulmonary disease); Anemia of chronic disease; Thyroid disease; Depression; Anxiety; Hypertension; Thrush; Hypernatremia; Paroxysmal a-fib (10/15); Aortic stenosis (10/15); Electrolyte imbalance; Renal disorder; History of hemodialysis; Hyperglycemia; Encounter for wound care; Insomnia; Peripheral vascular disease; Generalized weakness; Chronic low back pain; Emphysema of lung; Hyperlipemia; and Allergic rhinitis.  has past surgical history that includes Esophagogastroduodenoscopy (N/A, 07/10/2014). Prior to Admission medications   Medication Sig Start Date End Date Taking? Authorizing Provider  ALPRAZolam (XANAX) 0.25 MG tablet 0.25 mg by PEG Tube route 2 (two) times daily.   Yes Historical Provider, MD  aspirin EC 81 MG tablet 81 mg by PEG Tube route daily.   Yes Historical Provider, MD  atorvastatin (LIPITOR) 40 MG tablet 40 mg by PEG Tube route daily.   Yes Historical Provider, MD  budesonide (PULMICORT) 0.5 MG/2ML nebulizer solution 0.5 mg by Tracheal Tube route every 12 (twelve) hours.   Yes Historical Provider, MD  carvedilol (COREG) 3.125 MG tablet 3.125 mg by PEG Tube route 2 (two) times daily with a meal.   Yes Historical Provider, MD  chlorhexidine (PERIDEX) 0.12 % solution Use as directed 15 mLs in the mouth or throat 2 (two) times daily.   Yes Historical Provider, MD  Cholecalciferol (VITAMIN D3) 2000 UNITS TABS 2,000 Units by PEG Tube route daily.   Yes Historical Provider, MD  ciprofloxacin (CIPRO) 400 MG/200ML SOLN Inject 400 mg into the vein every 12 (twelve) hours. For 8 days   Yes Historical Provider, MD  enoxaparin (LOVENOX) 40 MG/0.4ML injection Inject 40 mg into the skin daily.   Yes Historical Provider, MD  guaifenesin (HUMIBID E) 400 MG TABS tablet 400 mg by PEG Tube route 2 (two) times daily.   Yes Historical Provider, MD  insulin aspart (NOVOLOG FLEXPEN) 100 UNIT/ML FlexPen Inject 1-12 Units into the skin every 6 (six) hours. 111-200=1 units 151-200=2 units 201-250=4  units 251-300=6 units 301-350=8 units 351-400=10 units 401-450=12 units or greater   Yes Historical Provider, MD  ipratropium-albuterol (DUONEB) 0.5-2.5 (3) MG/3ML SOLN Take 3 mLs by nebulization every 6 (six) hours.   Yes Historical Provider, MD  Lactobacillus Rhamnosus, GG, (CULTURELLE PO) 1 capsule by PEG Tube route 2 (two) times daily.   Yes Historical Provider, MD  levothyroxine (SYNTHROID, LEVOTHROID) 200 MCG tablet 200 mcg by PEG Tube route daily before breakfast.    Yes Historical Provider, MD  Magnesium Hydroxide (MILK OF MAGNESIA CONCENTRATE PO) 30 mLs by PEG Tube route daily as needed (constipation).   Yes Historical Provider, MD  Nutritional Supplements (NUTREN 2.0 PO) 240 mLs by Pump Prime route every 6 (six) hours. 20ml/hr   Yes Historical Provider, MD  ondansetron (ZOFRAN) 4 MG tablet 4 mg by PEG Tube route every 6 (six) hours as needed for nausea or vomiting.   Yes Historical Provider, MD  oxyCODONE (OXY IR/ROXICODONE) 5 MG immediate release tablet 5 mg by PEG Tube route every 6 (six) hours as needed for moderate pain or severe pain.   Yes Historical Provider, MD  pantoprazole sodium (PROTONIX) 40 mg/20 mL PACK Place 20 mLs (40 mg total) into feeding tube 2 (two) times daily. 07/16/14  Yes Duayne Cal, NP  predniSONE (DELTASONE) 5 MG tablet 5 mg by PEG Tube route daily with breakfast.    Yes Historical Provider, MD  PROTEIN PO Give 74 mLs by tube daily.   Yes Historical Provider, MD  sertraline (ZOLOFT) 100 MG tablet 100 mg by PEG Tube route daily.   Yes Historical Provider, MD  traZODone (DESYREL) 50 MG tablet Take 50 mg by mouth at bedtime as needed for sleep.   Yes Historical Provider, MD  Water For Irrigation, Sterile (FREE WATER) SOLN Place 300 mLs into feeding tube every 4 (four) hours. 07/16/14  Yes Duayne Cal, NP  amiodarone (PACERONE) 200 MG tablet 200 mg by PEG Tube route 2 (two) times daily.    Historical Provider, MD   Allergies  Allergen Reactions  . Zyrtec  [Cetirizine] Other (See Comments) and Anaphylaxis    ON MAR  . Augmentin [Amoxicillin-Pot Clavulanate] Other (See Comments)    Lethargy On MAR  . Bactrim [Sulfamethoxazole-Trimethoprim] Other (See Comments) and Rash    ON  MAR  . Neo-Synephrine  [Oxymetazoline] Other (See Comments)    Sneezed for 3 hours after taking it (nasal route)  . Neo-Synephrine 12 Hour Spray [Nasal Spray] Other (See Comments)    UNKNOWN  . Sudafed [Pseudoephedrine Hcl] Other (See Comments)    ON MAR    FAMILY HISTORY:  has no family status information on file.  SOCIAL HISTORY:  reports that she has quit smoking. Her smoking use included Cigarettes. She has a 50 pack-year smoking history. She has never used smokeless tobacco. She reports that she does not drink alcohol or use illicit drugs.  REVIEW OF SYSTEMS:  Cannot obtain as on ventilator  SUBJECTIVE:   VITAL SIGNS: Temp:  [98.7 F (37.1 C)] 98.7 F (37.1 C) (02/05 0546) Pulse Rate:  [83-88] 84 (02/05 0800) Resp:  [20-31] 20 (02/05 0800) BP: (104-109)/(51-56) 107/51 mmHg (02/05 0800) SpO2:  [99 %-100 %] 100 % (02/05 0800) FiO2 (%):  [30 %] 30 % (02/05 0535) HEMODYNAMICS:   VENTILATOR SETTINGS: Vent Mode:  [-] SIMV;PSV FiO2 (%):  [30 %]  30 % Set Rate:  [12 bmp] 12 bmp Vt Set:  [500 mL] 500 mL PEEP:  [5 cmH20] 5 cmH20 Pressure Support:  [12 cmH20] 12 cmH20 Plateau Pressure:  [17 cmH20] 17 cmH20 INTAKE / OUTPUT: No intake or output data in the 24 hours ending 08/03/14 0902  PHYSICAL EXAMINATION: General:  Chronically ill-appearing on vent Neuro:  Awake and alert, answers questions, follows commands, moves all 4 extremities HEENT:  NCAT, trach site clean dry and intact Cardiovascular:  Regular rate and rhythm normal S1-S2 Lungs:  Clear to auscultation bilaterally Abdomen:  Bowel sounds positive, mildly tender without guarding in the left lower quadrant, no masses palpable Musculoskeletal:  Normal bulk and tone Skin:  No  rash  LABS:  CBC  Recent Labs Lab 08/03/14 0550  WBC 11.9*  HGB 7.2*  HCT 22.8*  PLT 212   Coag's  Recent Labs Lab 08/03/14 0550  INR 1.06   BMET  Recent Labs Lab 08/03/14 0550  NA 127*  K 4.9  CL 91*  CO2 30  BUN 30*  CREATININE 0.78  GLUCOSE 233*   Electrolytes  Recent Labs Lab 08/03/14 0550  CALCIUM 9.2   Sepsis Markers No results for input(s): LATICACIDVEN, PROCALCITON, O2SATVEN in the last 168 hours. ABG No results for input(s): PHART, PCO2ART, PO2ART in the last 168 hours. Liver Enzymes No results for input(s): AST, ALT, ALKPHOS, BILITOT, ALBUMIN in the last 168 hours. Cardiac Enzymes No results for input(s): TROPONINI, PROBNP in the last 168 hours. Glucose No results for input(s): GLUCAP in the last 168 hours.  Imaging No results found.   ASSESSMENT / PLAN:  PULMONARY Tracheostomy placed October 2015 A: Chronic respiratory failure with ventilator dependence secondary to COPD COPD, not in exacerbation P:   Continue ventilator per settings from [facility] with SIMV Continue Pulmicort twice a day Continue DuoNeb 4 times a day  CARDIOVASCULAR A: Mild hypotension, not in shock Paroxysmal A. fib Status post Maze procedure Status post bovine aortic valve replacement in October 2015 P:  Check lactic acid Continue Coreg Continue statin Hold aspirin for now considering bleeding  RENAL A:  Hyponatremia likely related to chronic vent state, SIADH P:   Remove Foley catheter which has been present since January 16 Monitor BMET and UOP Replace electrolytes as needed   GASTROINTESTINAL A:  GI bleeding, does not appear to be actively bleeding now, presumably of lower GI source by history Protein-calorie malnutrition, chronically fed through PEG tube P:   Nothing by mouth Hold antiplatelet and anticoagulants Monitor CBC Consult [facility] as they saw her in December 2016  HEMATOLOGIC A:  Anemia, secondary to bleeding P:   Type and crossmatch Transfuse PRBCs if hemoglobin less than 7  INFECTIOUS A:  No acute issues, on Cipro for uncertain reason has not documented in [facility] progress notes Chronic Foley, infection risk Recent bacteremia P:   Check blood cultures, remove Foley, check urinalysis Hold Cipro  BCx2 08/03/2014>   ENDOCRINE A:  Chronic prednisone, presumably due to COPD Relative adrenal insufficiency in the setting of bleeding Diabetes mellitus P:   Sliding scale insulin Hydrocortisone 50 every 12 hours today, likely wean tomorrow  NEUROLOGIC A:  No acute issues P:   When necessary Tylenol   FAMILY  - Updates: Updated son at bedside 08/03/2014   Heber CarolinaBrent Rodolfo Gaster, MD [facility] Pager: [phone] Cell: [phone] If no response, call [phone]  08/03/2014, 9:02 AM

## 2014-08-03 NOTE — Progress Notes (Signed)
Pt had small soft dark stool, dark blood grossly present.    Samantha Hubbard, Samantha Masella Moore, RN

## 2014-08-03 NOTE — Care Management Note (Addendum)
    Page 1 of 1   08/06/2014     9:45:30 AM CARE MANAGEMENT NOTE 08/06/2014  Patient:  Samantha Hubbard,Samantha Hubbard   Account Number:  [id number]  Date Initiated:  08/03/2014  Documentation initiated by:  Junius CreamerWELL,DEBBIE  Subjective/Objective Assessment:   adm w gi bleed     Action/Plan:   from [facility]   Anticipated DC Date:     Anticipated DC Plan:  SKILLED NURSING FACILITY  In-house referral  Clinical Social Worker         Choice offered to / List presented to:             Status of service:   Medicare Important Message given?  YES (If response is "NO", the following Medicare IM given date fields will be blank) Date Medicare IM given:  08/06/2014 Medicare IM given by:  Junius CreamerWELL,DEBBIE Date Additional Medicare IM given:   Additional Medicare IM given by:    Discharge Disposition:    Per UR Regulation:  Reviewed for med. necessity/level of care/duration of stay  If discussed at Long Length of Stay Meetings, dates discussed:    Comments:

## 2014-08-03 NOTE — Progress Notes (Signed)
INITIAL NUTRITION ASSESSMENT  DOCUMENTATION CODES Per approved criteria  -Not Applicable   INTERVENTION: -RD to follow for diet advancement and initiation of nutrition support -When TF is resumed, recommend:  -Initiate bolus feedings of one can (237 ml) of Osmolite 1.2 4 times daily  -30 ml Prostat daily.    Tube feeding regimen provides 1240 kcal (93% of needs), 68 grams of protein, and 780 ml of H2O.   NUTRITION DIAGNOSIS: Inadequate oral intake related to inability to eat as evidenced by NPO.   Goal: Pt will meet >90% of estimated nutritional needs  Monitor:  Initiation of nutrition support, respiratory status, diet advancement, PO intake, labsm weight changes, I/O's  Reason for Assessment: VDRF  80 y.o. female  Admitting Dx: <principal problem not specified>  80 year old female with chronic respiratory failure post valve replacement in 2015 admitted on 08/03/2014 to [facility] hospital with a lower GI bleed.  ASSESSMENT: Patient is currently intubated on ventilator support MV: 10.9 L/min Temp (24hrs), Avg:98.7 F (37.1 C), Min:98.6 F (37 C), Max:98.7 F (37.1 C)  Pt admitted with LGIB. She has been transferred from [place]. Spoke with nurse who confirmed that pt with chronic trach and PEG. She revealed that pt was weaned off vent at [place] last week, but is currently on the vent now until condition improves. Pt continued to receive TF PTA, however, pt was cleared for PO intake by speech therapy PTA, although intake is very minimal. Diet PTA was thin liquids with soft diet. Son at bedside reports generally eats foods such as eggs and applesauce and tolerates well, however, also confirms minimal PO intake. Pt tires easily.  RN reports that pt will be NPO for colonoscopy and feedings will also be help at this time. Feedings will be resumed once GI work-up is completed. Home TF regimen is bolus feedings of 240 ml of Nutren every 6 hours, 74 ml of protein supplement daily,  and 300 ml free water flush every 4 hours. Total regimen provides 1920 kcal, 77 g protein and 2462 ml of fluid.  Suspect fat and muscle depletion is related to advanced age and limited activity. Labs reviewed. Na: 127, CO2: 91, BUN: 30, Glucose: 233.   Nutrition Focused Physical Exam:  Subcutaneous Fat:  Orbital Region: WDL Upper Arm Region: moderate depletion Thoracic and Lumbar Region: WDL  Muscle:  Temple Region: WDL Clavicle Bone Region: WDL Clavicle and Acromion Bone Region: WDL Scapular Bone Region: WDL Dorsal Hand: moderate depletion Patellar Region: moderate depletion Anterior Thigh Region: moderate depletion Posterior Calf Region: moderate depletion  Edema: none present  Height: Ht Readings from Last 1 Encounters:  07/10/14 '5\' 2"'  (1.575 m)    Weight: Wt Readings from Last 1 Encounters:  07/16/14 133 lb 6.1 oz (60.5 kg)    Ideal Body Weight: 110#  % Ideal Body Weight: 121%  Wt Readings from Last 10 Encounters:  07/16/14 133 lb 6.1 oz (60.5 kg)    Usual Body Weight: 148#  % Usual Body Weight: 90%  BMI:  Estimated body mass index is 24.39 kg/(m^2) as calculated from the following:   Height as of 07/10/14: '5\' 2"'  (1.575 m).   Weight as of 07/10/14: 133 lb 6.1 oz (60.5 kg). Normal weight range  Estimated Nutritional Needs: Kcal: 1328.6 Protein: 67-77 grams Fluid: >1.5 L  Skin: DTI on rt buttocks  Diet Order:  NPO  EDUCATION NEEDS: -Education not appropriate at this time   Intake/Output Summary (Last 24 hours) at 08/03/14 1537 Last data filed at  08/03/14 0935  Gross per 24 hour  Intake      0 ml  Output    250 ml  Net   -250 ml    Last BM: PTA  Labs:   Recent Labs Lab 08/03/14 0550  NA 127*  K 4.9  CL 91*  CO2 30  BUN 30*  CREATININE 0.78  CALCIUM 9.2  GLUCOSE 233*    CBG (last 3)   Recent Labs  08/03/14 1340  GLUCAP 180*    Scheduled Meds: . ALPRAZolam  0.25 mg Per Tube BID  . amiodarone  200 mg Per Tube BID  .  atorvastatin  40 mg Per Tube Daily  . budesonide  0.5 mg Nebulization BID  . carvedilol  3.125 mg Per Tube BID WC  . cholecalciferol  2,000 Units Oral Daily  . hydrocortisone sodium succinate  50 mg Intravenous Q12H  . insulin aspart  0-15 Units Subcutaneous 6 times per day  . ipratropium-albuterol  3 mL Nebulization Q6H  . [START ON 08/04/2014] levothyroxine  200 mcg Per Tube QAC breakfast  . metoCLOPramide (REGLAN) injection  10 mg Intravenous Once   Followed by  . [START ON 08/04/2014] metoCLOPramide (REGLAN) injection  10 mg Intravenous Once  . pantoprazole (PROTONIX) IV  40 mg Intravenous Q12H  . peg 3350 powder  0.5 kit Per Tube Once   And  . [START ON 08/04/2014] peg 3350 powder  0.5 kit Per Tube Once  . sertraline  100 mg Per Tube Daily    Continuous Infusions: . sodium chloride 50 mL/hr at 08/03/14 1421    Past Medical History  Diagnosis Date  . Acute respiratory failure with hypoxia and hypercapnia   . Ventilator dependent   . Healthcare-associated pneumonia   . Pneumonia, Klebsiella   . Left ventricular diastolic dysfunction   . Chronic pain syndrome   . Critical illness myopathy   . Severe protein-calorie malnutrition   . Dysphagia   . COPD (chronic obstructive pulmonary disease)     emphysema.   . Anemia of chronic disease     acute blood loss anemia 06/2014, received 2 units PRBCs  . Thyroid disease     Hypothyroidism  . Depression with anxiety   . Hypertension   . Thrush   . Hypernatremia   . Paroxysmal a-fib 10/15    status post Maze   . Aortic stenosis 10/15    status post AV replacement   . Acute renal failure (ARF) 03/2014    ARF reuiring hemodialysis post AVR  . Hyperglycemia   . Peripheral vascular disease   . Chronic low back pain   . Hyperlipemia   . Allergic rhinitis     Past Surgical History  Procedure Laterality Date  . Esophagogastroduodenoscopy N/A 07/10/2014    Procedure: ESOPHAGOGASTRODUODENOSCOPY (EGD);  Surgeon: Inda Castle, MD;   Location: [place];  Service: Endoscopy;  Laterality: N/A;  . Aortic valve replacement  03/2014    at [place].   . Maze  03/2014    [facility] for critical AS  . Peg placement  04/2014    [facility]  . Esophagogastroduodenoscopy  05/2014    for GIB. clipped ulcer laying beneath peg bumper. at [place]  . Tracheostomy  04/2014    at [place].     Mariela Rex A. Jimmye Norman, RD, LDN, CDE Pager: [phone] After hours Pager: [phone]

## 2014-08-03 NOTE — ED Notes (Signed)
Respiratory tech at bedside

## 2014-08-03 NOTE — ED Notes (Signed)
MD at bedside to evaluate pt's skin rash. Reports the rash does appear to be crusted and pt does not need to be on Airborne precautions.

## 2014-08-03 NOTE — ED Notes (Signed)
Critical Care at bedside.  

## 2014-08-03 NOTE — Consult Note (Signed)
[facility] Consult: 12:56 PM 08/03/2014  LOS: 0 days    Referring Provider: Dr Kendrick Fries Primary Care Physician:  Hillary Bow, MD Primary Gastroenterologist:  Seen as unassigned call by Dr Arlyce Dice 07/09/14.   [facility] in [place].    Reason for Consultation:  Hematochezia.    HPI: Samantha Hubbard is a 80 y.o. female.  Residence is in [place], [place]. Independent/fully functional at home prior to events of late 2015. Diastolic CHF, COPD, chronic anemia, IDDM.  Has had at least 2 previous colonoscopies in [place] and in [place] for eval of intermittent rectal bleeding that was attributed to constipation. Care Everywhere review endorses diagnoses of diverticulosis, she thinks they may have removed something at colonoscopy so question history of colon polyps. Her latest colonoscopy was around 5 years ago.  Hx PAF and critical aortic stenosis.  05/14/14 - 07/06/14 admission to [facility].  S/p 04/02/14 Maze procedure, s/p 10/6 aortic valve replacement. Post op resp failure leading to trach 05/01/14. Required temporary dialysis for renal failure. S/p PEG 05/15/2014 at [place]. Within one week of PEG had hematemesis and passing blood. S/p 05/31/14 EGD/endoclipping of small, nonbleeding ulcer beneath G-tube bumper at gastrostomy site edge.V tach required DCCV. Anemia was treated with Aranesp at [facility]. Transferred 11/16 to [facility] and then to [facility] 1/8 after inability to wean off vent.  Admitted to [facility] hospital 1/12 - 07/16/14 for GI bleed with melena, Klebsiella pneumonia grew from blood.  07/10/14  EGD for melena  1. Chronic gastritis (inflammation) was found in the gastricantrum 2. There were 2 free floating tubular structures in the gastric body that appeared to be small pieces of  tubing.  Gastrostomy site was normal with a PEG tube in place. There was a nearby endoscopic clip. No fresh or old blood was seen 3. EGD was otherwise normal Received 2 units PRBCs during admission. Hgb nadir 7.1, 8.5 on 1/18.   After return to [facility] (on Lovenox and 81 ASA), she weaned off vent on 07/27/14.  Was up to chair and engaging in PT.  After 1/29 FEES swallow study was cleared for liquids and soft diet but still requiring tube feedings due to limited po intake. .  Late PM on 2/4 began having hematochezia vs deeper maroon stools.  Passed 4 bloody stools and transferred to [facility] ED. Some mild pain in LLQ: pt says tender and mild.  Some nausea.     Hgb is 7.2.  Coags, platelets normal. Lactic acid is pending. BPs 90s-112/47-54.  Pulse in 80s.  She does not feel dizzy or diaphoretic.     Past Medical History  Diagnosis Date  . Acute respiratory failure with hypoxia and hypercapnia   . Ventilator dependent   . Healthcare-associated pneumonia   . Pneumonia, Klebsiella   . Left ventricular diastolic dysfunction   . Chronic pain syndrome   . Critical illness myopathy   . Severe protein-calorie malnutrition   . Dysphagia   . COPD (chronic obstructive pulmonary disease)     emphysema.   . Anemia of chronic disease  acute blood loss anemia 06/2014, received 2 units PRBCs  . Thyroid disease     Hypothyroidism  . Depression with anxiety   . Hypertension   . Thrush   . Hypernatremia   . Paroxysmal a-fib 10/15    status post Maze   . Aortic stenosis 10/15    status post AV replacement   . Acute renal failure (ARF) 03/2014    ARF reuiring hemodialysis post AVR  . Hyperglycemia   . Peripheral vascular disease   . Chronic low back pain   . Hyperlipemia   . Allergic rhinitis     Past Surgical History  Procedure Laterality Date  . Esophagogastroduodenoscopy N/A 07/10/2014    Procedure: ESOPHAGOGASTRODUODENOSCOPY (EGD);  Surgeon: Louis Meckelobert D Kaplan, MD;  [facility];   Service: Endoscopy;  Laterality: N/A;  . Aortic valve replacement  03/2014    at [facility].   . Maze  03/2014    [facility] for critical AS  . Peg placement  04/2014    [facility]  . Esophagogastroduodenoscopy  05/2014    for GIB. clipped ulcer [place] [facility]  . [place]  [place]    [place] [facility].     Prior to Admission medications   Medication Sig Start Date End Date Taking? Authorizing Provider  ALPRAZolam (XANAX) 0.25 MG tablet 0.25 mg by PEG Tube route 2 (two) times daily.   Yes Historical Provider, MD  aspirin EC 81 MG tablet 81 mg by PEG Tube route daily.   Yes Historical Provider, MD  atorvastatin (LIPITOR) 40 MG tablet 40 mg by PEG Tube route daily.   Yes Historical Provider, MD  budesonide (PULMICORT) 0.5 MG/2ML nebulizer solution 0.5 mg by Tracheal Tube route every 12 (twelve) hours.   Yes Historical Provider, MD  carvedilol (COREG) 3.125 MG tablet 3.125 mg by PEG Tube route 2 (two) times daily with a meal.   Yes Historical Provider, MD  chlorhexidine (PERIDEX) 0.12 % solution Use as directed 15 mLs in the mouth or throat 2 (two) times daily.   Yes Historical Provider, MD  Cholecalciferol (VITAMIN D3) 2000 UNITS TABS 2,000 Units by PEG Tube route daily.   Yes Historical Provider, MD  ciprofloxacin (CIPRO) 400 MG/200ML SOLN Inject 400 mg into the vein every 12 (twelve) hours. For 8 days   Yes Historical Provider, MD  enoxaparin (LOVENOX) 40 MG/0.4ML injection Inject 40 mg into the skin daily.   Yes Historical Provider, MD  guaifenesin (HUMIBID E) 400 MG TABS tablet 400 mg by PEG Tube route 2 (two) times daily.   Yes Historical Provider, MD  insulin aspart (NOVOLOG FLEXPEN) 100 UNIT/ML FlexPen Inject 1-12 Units into the skin every 6 (six) hours. 111-200=1 units 151-200=2 units 201-250=4 units 251-300=6 units 301-350=8 units 351-400=10 units 401-450=12 units or greater   Yes Historical Provider, MD  ipratropium-albuterol (DUONEB) 0.5-2.5 (3) MG/3ML SOLN  Take 3 mLs by nebulization every 6 (six) hours.   Yes Historical Provider, MD  Lactobacillus Rhamnosus, GG, (CULTURELLE PO) 1 capsule by PEG Tube route 2 (two) times daily.   Yes Historical Provider, MD  levothyroxine (SYNTHROID, LEVOTHROID) 200 MCG tablet 200 mcg by PEG Tube route daily before breakfast.    Yes Historical Provider, MD  Magnesium Hydroxide (MILK OF MAGNESIA CONCENTRATE PO) 30 mLs by PEG Tube route daily as needed (constipation).   Yes Historical Provider, MD  Nutritional Supplements (NUTREN 2.0 PO) 240 mLs by Pump Prime route every 6 (six) hours. 840ml/hr   Yes Historical Provider, MD  ondansetron (ZOFRAN) 4 MG tablet 4 mg by PEG Tube route every 6 (six) hours as needed for nausea or vomiting.   Yes Historical Provider, MD  oxyCODONE (OXY IR/ROXICODONE) 5 MG immediate release tablet 5 mg by PEG Tube route every 6 (six) hours as needed for moderate pain or severe pain.   Yes Historical Provider, MD  pantoprazole sodium (PROTONIX) 40 mg/20 mL PACK Place 20 mLs (40 mg total) into feeding tube 2 (two) times daily. 07/16/14  Yes Duayne Cal, NP  predniSONE (DELTASONE) 5 MG tablet 5 mg by PEG Tube route daily with breakfast.    Yes Historical Provider, MD  PROTEIN PO Give 74 mLs by tube daily.   Yes Historical Provider, MD  sertraline (ZOLOFT) 100 MG tablet 100 mg by PEG Tube route daily.   Yes Historical Provider, MD  traZODone (DESYREL) 50 MG tablet Take 50 mg by mouth at bedtime as needed for sleep.   Yes Historical Provider, MD  Water For Irrigation, Sterile (FREE WATER) SOLN Place 300 mLs into feeding tube every 4 (four) hours. 07/16/14  Yes Duayne Cal, NP  amiodarone (PACERONE) 200 MG tablet 200 mg by PEG Tube route 2 (two) times daily.    Historical Provider, MD    Scheduled Meds:  Infusions:  PRN Meds:    Allergies as of 08/03/2014 - Review Complete 08/03/2014  Allergen Reaction Noted  . Zyrtec [cetirizine] Other (See Comments) and Anaphylaxis 07/10/2014  . Augmentin  [amoxicillin-pot clavulanate] Other (See Comments) 07/10/2014  . Bactrim [sulfamethoxazole-trimethoprim] Other (See Comments) and Rash 07/10/2014  . Neo-synephrine  [oxymetazoline] Other (See Comments) 07/11/2014  . Neo-synephrine 12 hour spray [nasal spray] Other (See Comments) 07/10/2014  . Sudafed [pseudoephedrine hcl] Other (See Comments) 07/10/2014    History reviewed. No pertinent family history.  History   Social History  . Marital Status: Widowed    Spouse Name: N/A    Number of Children: N/A  . Years of Education: N/A   Occupational History  . Not on file.   Social History Main Topics  . Smoking status: Former Smoker -- 1.00 packs/day for 50 years    Types: Cigarettes  . Smokeless tobacco: Never Used  . Alcohol Use: No  . Drug Use: No  . Sexual Activity: Not on file   Other Topics Concern  . Not on file   Social History Narrative    REVIEW OF SYSTEMS: Constitutional:  Slowly getting stronger. Limited ambulation. ENT:  No nose bleeds Pulm:  No acute dyspnea. No chronic cough CV:  No palpitations, no LE edema.  GU:  No hematuria, no frequency GI:  Per HPI.  No choking on food Heme:  Per HPI   Transfusions:  Per HPI Neuro:  No headaches, no peripheral tingling or numbness Derm:  No itching, no rash or sores.  Endocrine:  No sweats or chills.  No polyuria or dysuria Immunization:  No immunizations documented in Epic. Not asked the patient about vaccinations Travel:  None beyond local counties in last few months.    PHYSICAL EXAM: Vital signs in last 24 hours: Filed Vitals:   08/03/14 1030  BP: 96/57  Pulse: 80  Temp:  98.7   Resp: 19   Wt Readings from Last 3 Encounters:  07/16/14 133 lb 6.1 oz (60.5 kg)    General: Pleasant, frail, ill appearing elderly WF. Head:  No facial asymmetry or swelling.  Eyes:  No scleral icterus. No conjunctival pallor Ears:  No obvious hearing deficit  Nose:  No congestion, no discharge Mouth:  No sores, no  bleeding. Mucous membranes moist. Neck:  No JVD, no TMG, no masses.  Trach collar in place. Lungs:  Course ronchi bil.  Heart: RRR. No MRG. Abdomen:  Soft, NT, ND.  No mass or HSM.  BS active. .   Rectal: deferred.    Musc/Skeltl: no joint swelling redness, deformity Extremities:  No pedal edema  Neurologic:  Oriented x 3.  Appropriate.  No tremors.  No gross deficits or limb weakness.  Skin:  No rash, no sores Tattoos:  none Nodes:  No cervical or axillary adenopathy   Psych:  Cooperative, engaged, affect normal.    Intake/Output from previous day:   Intake/Output this shift: Total I/O In: -  Out: 250 [Urine:250]  LAB RESULTS:  Recent Labs  08/03/14 0550  WBC 11.9*  HGB 7.2*  HCT 22.8*  PLT 212   BMET Lab Results  Component Value Date   NA 127* 08/03/2014   NA 131* 07/16/2014   NA 137 07/15/2014   K 4.9 08/03/2014   K 4.2 07/16/2014   K 4.6 07/15/2014   CL 91* 08/03/2014   CL 95* 07/16/2014   CL 97 07/15/2014   CO2 30 08/03/2014   CO2 32 07/16/2014   CO2 35* 07/15/2014   GLUCOSE 233* 08/03/2014   GLUCOSE 193* 07/16/2014   GLUCOSE 195* 07/15/2014   BUN 30* 08/03/2014   BUN 18 07/16/2014   BUN 21 07/15/2014   CREATININE 0.78 08/03/2014   CREATININE 0.69 07/16/2014   CREATININE 0.84 07/15/2014   CALCIUM 9.2 08/03/2014   CALCIUM 8.7 07/16/2014   CALCIUM 8.6 07/15/2014   LFT No results for input(s): PROT, ALBUMIN, AST, ALT, ALKPHOS, BILITOT, BILIDIR, IBILI in the last 72 hours. PT/INR Lab Results  Component Value Date   INR 1.06 08/03/2014   INR 1.07 07/13/2014   INR 1.02 07/10/2014   Hepatitis Panel No results for input(s): HEPBSAG, HCVAB, HEPAIGM, HEPBIGM in the last 72 hours. C-Diff No components found for: CDIFF Lipase  No results found for: LIPASE  Drugs of Abuse  No results found for: LABOPIA, COCAINSCRNUR, LABBENZ, AMPHETMU, THCU, LABBARB   RADIOLOGY STUDIES: No results found.  ENDOSCOPIC STUDIES: Per HPI  IMPRESSION:   *   Hematochezia, relatively painless.. Hematochezia versus melena 3 weeks ago.  Previous EGD for post PEG bleeding/hematemesis in 05/2014 resulted in placement of Endo Clip.  EGD on 07/10/14 showed chronic gastritis, and endoclip near the PEG site.  Also seen were some sort of narrow gauge plastic tubing fragments.  Has been taking Protonix twice daily. Patient has undergone multiple colonoscopies for rectal bleeding in the past. Per family report, findings have always been benign. Rule out diverticular bleed, rule out colitis (especially ischemic), rule out AVM.  *  Chronic Lovenox and 81 mg ASA.  *  Status post 03/2014 maze procedure and aortic valve replacement.  *  03/2014 postoperative respiratory failure leading to eventual tracheostomy. Was weaned off the vent at the end of 06/2014.  Background of COPD, diastolic CHF.  Takes chronic low dose presdnisone.   *  S/p PEG 05/2014.  Cleared to have liquids and soft diet by FEES of 07/27/14.  Still requiring supplemental q 6 hour bolus tube feeds.   *  Chronic anemia.  Transfused 2 units packed red blood cells for ABL anemia 3 weeks ago.  *  IDDM type 2.     PLAN:     *  Colonoscopy?  Could prep via  tube.  *  Continue twice a day Protonix for now.  They be able to switch from IV to PO.    Jennye Moccasin  08/03/2014, 12:56 PM Pager: [phone]

## 2014-08-03 NOTE — Progress Notes (Signed)
eLink Physician-Brief Progress Note Patient Name: Samantha BoronJocelyn Peart DOB: 03/19/1935 MRN: [id number]   Date of Service  08/03/2014  HPI/Events of Note  Drop hgb, hemodynamics wnl  eICU Interventions  1 unit, cbc to follow     Intervention Category Intermediate Interventions: Bleeding - evaluation and treatment with blood products  Nelda BucksFEINSTEIN,DANIEL J. 08/03/2014, 5:28 PM

## 2014-08-03 NOTE — ED Notes (Addendum)
Pt has an area of blanchable redness on the coccyx. There appears to be a small area of urine-induced dermatitis to the left thigh. Pt also has a large area of breakdown to her thoracic spine, with scant drainage. Area appears dry and flaky.

## 2014-08-03 NOTE — ED Notes (Signed)
Blood is ready for the pt in blood bank.

## 2014-08-04 ENCOUNTER — Encounter (HOSPITAL_COMMUNITY)
Admission: EM | Disposition: A | Discharge status: Nursing Home (Includes ICF) | Payer: Medicare Other | Source: Home / Self Care | Attending: Internal Medicine

## 2014-08-04 ENCOUNTER — Encounter (HOSPITAL_COMMUNITY): Payer: Self-pay

## 2014-08-04 ENCOUNTER — Encounter (HOSPITAL_COMMUNITY)
Admission: EM | Disposition: A | Discharge status: Nursing Home (Includes ICF) | Payer: Self-pay | Source: Home / Self Care | Attending: Internal Medicine

## 2014-08-04 HISTORY — PX: COLONOSCOPY: SHX5424

## 2014-08-04 LAB — CBC
HCT: 24.4 % — ABNORMAL LOW (ref 36.0–46.0)
HEMATOCRIT: 24.2 % — AB (ref 36.0–46.0)
HEMATOCRIT: 23.4 % — AB (ref 36.0–46.0)
HEMATOCRIT: 24.4 % — AB (ref 36.0–46.0)
HEMOGLOBIN: 8 g/dL — AB (ref 12.0–15.0)
HEMOGLOBIN: 8.2 g/dL — AB (ref 12.0–15.0)
HEMOGLOBIN: 7.7 g/dL — AB (ref 12.0–15.0)
Hemoglobin: 8.3 g/dL — ABNORMAL LOW (ref 12.0–15.0)
MCH: 29.4 pg (ref 26.0–34.0)
MCH: 29.1 pg (ref 26.0–34.0)
MCH: 29.7 pg (ref 26.0–34.0)
MCH: 29.4 pg (ref 26.0–34.0)
MCHC: 32.9 g/dL (ref 30.0–36.0)
MCHC: 33.1 g/dL (ref 30.0–36.0)
MCHC: 33.6 g/dL (ref 30.0–36.0)
MCHC: 34 g/dL (ref 30.0–36.0)
MCV: 86.5 fL (ref 78.0–100.0)
MCV: 88.3 fL (ref 78.0–100.0)
MCV: 88.4 fL (ref 78.0–100.0)
MCV: 89 fL (ref 78.0–100.0)
Platelets: 182 10*3/uL (ref 150–400)
Platelets: 184 10*3/uL (ref 150–400)
Platelets: 185 10*3/uL (ref 150–400)
Platelets: 189 10*3/uL (ref 150–400)
RBC: 2.65 MIL/uL — ABNORMAL LOW (ref 3.87–5.11)
RBC: 2.72 MIL/uL — AB (ref 3.87–5.11)
RBC: 2.76 MIL/uL — ABNORMAL LOW (ref 3.87–5.11)
RBC: 2.82 MIL/uL — ABNORMAL LOW (ref 3.87–5.11)
RDW: 17.1 % — ABNORMAL HIGH (ref 11.5–15.5)
RDW: 17.3 % — ABNORMAL HIGH (ref 11.5–15.5)
RDW: 17.3 % — ABNORMAL HIGH (ref 11.5–15.5)
RDW: 17.4 % — ABNORMAL HIGH (ref 11.5–15.5)
WBC: 12.4 10*3/uL — ABNORMAL HIGH (ref 4.0–10.5)
WBC: 10.5 10*3/uL (ref 4.0–10.5)
WBC: 13.7 10*3/uL — AB (ref 4.0–10.5)
WBC: 13.2 10*3/uL — ABNORMAL HIGH (ref 4.0–10.5)

## 2014-08-04 LAB — GLUCOSE, CAPILLARY
GLUCOSE-CAPILLARY: 119 mg/dL — AB (ref 70–99)
GLUCOSE-CAPILLARY: 130 mg/dL — AB (ref 70–99)
GLUCOSE-CAPILLARY: 75 mg/dL (ref 70–99)
Glucose-Capillary: 117 mg/dL — ABNORMAL HIGH (ref 70–99)
Glucose-Capillary: 85 mg/dL (ref 70–99)
Glucose-Capillary: 95 mg/dL (ref 70–99)

## 2014-08-04 LAB — CLOSTRIDIUM DIFFICILE BY PCR: Toxigenic C. Difficile by PCR: NEGATIVE

## 2014-08-04 LAB — BASIC METABOLIC PANEL
ANION GAP: 8 (ref 5–15)
BUN: 27 mg/dL — ABNORMAL HIGH (ref 6–23)
CALCIUM: 8.7 mg/dL (ref 8.4–10.5)
CO2: 26 mmol/L (ref 19–32)
Chloride: 98 mmol/L (ref 96–112)
Creatinine, Ser: 0.77 mg/dL (ref 0.50–1.10)
GFR calc non Af Amer: 79 mL/min — ABNORMAL LOW (ref >90)
Glucose, Bld: 122 mg/dL — ABNORMAL HIGH (ref 70–99)
POTASSIUM: 4.1 mmol/L (ref 3.5–5.1)
Sodium: 132 mmol/L — ABNORMAL LOW (ref 135–145)

## 2014-08-04 LAB — PHOSPHORUS: Phosphorus: 4.5 mg/dL (ref 2.3–4.6)

## 2014-08-04 LAB — MAGNESIUM: Magnesium: 2.2 mg/dL (ref 1.5–2.5)

## 2014-08-04 SURGERY — COLONOSCOPY
Anesthesia: Moderate Sedation

## 2014-08-04 MED ORDER — FENTANYL CITRATE 0.05 MG/ML IJ SOLN
INTRAMUSCULAR | Status: DC | PRN
  Administered 2014-08-04 (×2): 12.5 ug via INTRAVENOUS

## 2014-08-04 MED ORDER — MIDAZOLAM HCL 5 MG/5ML IJ SOLN
INTRAMUSCULAR | Status: DC | PRN
  Administered 2014-08-04: 1 mg via INTRAVENOUS

## 2014-08-04 MED ORDER — OSMOLITE 1.5 CAL PO LIQD
1000.0000 mL | ORAL | Status: DC
  Administered 2014-08-04: 1000 mL
  Filled 2014-08-04 (×3): qty 1000

## 2014-08-04 MED ORDER — PNEUMOCOCCAL VAC POLYVALENT 25 MCG/0.5ML IJ INJ
0.5000 mL | INJECTION | INTRAMUSCULAR | Status: DC
  Filled 2014-08-04: qty 0.5

## 2014-08-04 MED ORDER — IPRATROPIUM-ALBUTEROL 0.5-2.5 (3) MG/3ML IN SOLN
3.0000 mL | Freq: Four times a day (QID) | RESPIRATORY_TRACT | Status: DC
  Administered 2014-08-04 – 2014-08-08 (×17): 3 mL via RESPIRATORY_TRACT
  Filled 2014-08-04 (×17): qty 3

## 2014-08-04 MED ORDER — CARVEDILOL 3.125 MG PO TABS
3.1250 mg | ORAL_TABLET | Freq: Two times a day (BID) | ORAL | Status: DC
  Administered 2014-08-04 – 2014-08-08 (×9): 3.125 mg
  Filled 2014-08-04 (×10): qty 1

## 2014-08-04 MED ORDER — HYDROCORTISONE NA SUCCINATE PF 100 MG IJ SOLR
25.0000 mg | Freq: Two times a day (BID) | INTRAMUSCULAR | Status: AC
  Administered 2014-08-04 – 2014-08-05 (×3): 25 mg via INTRAVENOUS
  Filled 2014-08-04 (×3): qty 0.5

## 2014-08-04 MED ORDER — MIDAZOLAM HCL 5 MG/ML IJ SOLN
INTRAMUSCULAR | Status: AC
  Filled 2014-08-04: qty 2

## 2014-08-04 MED ORDER — FENTANYL CITRATE 0.05 MG/ML IJ SOLN
INTRAMUSCULAR | Status: AC
  Filled 2014-08-04: qty 2

## 2014-08-04 NOTE — Progress Notes (Signed)
[place] TEAM 1 - Stepdown/ICU TEAM Progress Note  Samantha Hubbard WUJ:[id number] DOB: 28-Aug-1934 DOA: 08/03/2014 PCP: Hillary Bow, MD  Admit HPI / Brief Narrative: 80 year old female who underwent a maze procedure and aortic valve replacement in the fall of 2015 and had postoperative course complicated by ventilator-dependent respiratory failure who comes to [facility] on 08/03/2014 from [facility] with a lower GI bleed. She had been hospitalized in December 2015 through January 2016 with a GI bleed and gram-negative rod bacteremia. During that time she had an endoscopy which showed chronic gastritis. She had been treated previously with full dose anticoagulation because of paroxysmal A fib. She was discharged to [facility] on 07/16/2014. Apparently she did well initially and by January 29 she had weaned off the ventilator temporarily and was able to use a Passy-Muir valve and drink some fluids. She had been participating in physical therapy and had been up to a chair. However, on 08/03/2013 she developed the sudden onset of lower GI bleeding. Apparently she had 4 bloody stools at [facility]. In the [facility] ED she was hemostatically stable and her hemoglobin was 7.6. She was complaining of very mild pain in her left lower quadrant.  HPI/Subjective: Has had a melanotic stool since admit.  Is currently resting comfortably on vent w/ no complaints whatsoever.  Denies cp, sob, n/v, or abdom pain.      Assessment/Plan:  Presumed LGIB Recent EGD noted chronic gastritis - GI following - for colonoscopy today   Acute on chronic blood loss anemia  Transfused 1U PRBC thus far - Hgb nadir of 6.5 w/ improvement to 8.2 post transfusion - now drifting back down suggesting ongoing loss - transfuse prn Hgb <7.0  Mild hypotension BP has improved / stabilized - follow w/ ongoing bleeding   Chronic prednisone, presumably due to COPD > Relative adrenal insufficiency Begin to taper stress dose  steroids   Chronic respiratory failure with ventilator dependence secondary to COPD Tracheostomy placed October 2015 - stable on chronic vent settings at this time   Hyponatremia  likely related to chronic vent state, SIADH - improving - follow   Paroxysmal A. Fib NSR at present - follow on tele until Hgb stable   Status post Maze procedure & bovine aortic valve replacement October 2015  DM CBG currently well controlled   Protein-calorie malnutrition chronically fed through PEG tube  Code Status: FULL Family Communication: spoke w/ son at bedside  Disposition Plan: SDU  Consultants: [facility]  Procedures: none  Antibiotics: none  DVT prophylaxis: SCDs  Objective: Blood pressure 118/47, pulse 78, temperature 98.8 F (37.1 C), temperature source Oral, resp. rate 25, weight 62.2 kg (137 lb 2 oz), SpO2 98 %.  Intake/Output Summary (Last 24 hours) at 08/04/14 1158 Last data filed at 08/04/14 0800  Gross per 24 hour  Intake 3958.17 ml  Output    211 ml  Net 3747.17 ml   Exam: General: No acute respiratory distress - alert and conversant  Lungs: Clear to auscultation bilaterally without wheezes or crackles Cardiovascular: Regular rate and rhythm without murmur gallop or rub Abdomen: Nontender, nondistended, soft, bowel sounds positive, no rebound, no ascites, no appreciable mass Extremities: No significant cyanosis, clubbing, or edema bilateral lower extremities  Data Reviewed: Basic Metabolic Panel:  Recent Labs Lab 08/03/14 0550 08/03/14 1609 08/04/14 0306  NA 127* 127* 132*  K 4.9 4.7 4.1  CL 91* 91* 98  CO2 GLUCOSE 233* 110* 122*  BUN 30* 28*  27*  CREATININE 0.78 0.76 0.77  CALCIUM 9.2 8.9 8.7  MG  --   --  2.2  PHOS  --   --  4.5    Liver Function Tests: No results for input(s): AST, ALT, ALKPHOS, BILITOT, PROT, ALBUMIN in the last 168 hours. No results for input(s): LIPASE, AMYLASE in the last 168 hours. No results for input(s):  AMMONIA in the last 168 hours.  Coags:  Recent Labs Lab 08/03/14 0550  INR 1.06   CBC:  Recent Labs Lab 08/03/14 0550 08/03/14 1609 08/03/14 2338 08/04/14 0306  WBC 11.9* 11.5* 13.7* 12.4*  NEUTROABS 10.2*  --   --   --   HGB 7.2* 6.5* 8.2* 8.0*  HCT 22.8* 19.9* 24.4* 24.2*  MCV 91.6 90.0 88.4 89.0  PLT 212 211 189 182   CBG:  Recent Labs Lab 08/03/14 1634 08/03/14 1951 08/04/14 0001 08/04/14 0325 08/04/14 0740  GLUCAP 98 137* 119* 117* 130*    Recent Results (from the past 240 hour(s))  Culture, blood (routine x 2)     Status: None (Preliminary result)   Collection Time: 08/03/14  1:00 PM  Result Value Ref Range Status   Specimen Description BLOOD LEFT ANTECUBITAL  Final   Special Requests BOTTLES DRAWN AEROBIC AND ANAEROBIC 10CC  Final   Culture   Final           BLOOD CULTURE RECEIVED NO GROWTH TO DATE CULTURE WILL BE HELD FOR 5 DAYS BEFORE ISSUING A FINAL NEGATIVE REPORT Performed at [facility]    Report Status PENDING  Incomplete  Culture, blood (routine x 2)     Status: None (Preliminary result)   Collection Time: 08/03/14  1:10 PM  Result Value Ref Range Status   Specimen Description BLOOD LEFT HAND  Final   Special Requests BOTTLES DRAWN AEROBIC ONLY 7CC  Final   Culture   Final           BLOOD CULTURE RECEIVED NO GROWTH TO DATE CULTURE WILL BE HELD FOR 5 DAYS BEFORE ISSUING A FINAL NEGATIVE REPORT Performed at [facility]    Report Status PENDING  Incomplete  Clostridium Difficile by PCR     Status: None   Collection Time: 08/04/14  1:03 AM  Result Value Ref Range Status   C difficile by pcr NEGATIVE NEGATIVE Final     Studies:  Recent x-ray studies have been reviewed in detail by the Attending Physician  Scheduled Meds:  Scheduled Meds: . ALPRAZolam  0.25 mg Per Tube BID  . amiodarone  200 mg Per Tube BID  . antiseptic oral rinse  15 mL Mouth Rinse QID  . atorvastatin  40 mg Per Tube Daily  . budesonide  0.5 mg  Nebulization BID  . carvedilol  3.125 mg Per Tube BID WC  . chlorhexidine  15 mL Mouth/Throat BID  . cholecalciferol  2,000 Units Oral Daily  . hydrocortisone sodium succinate  50 mg Intravenous Q12H  . insulin aspart  0-15 Units Subcutaneous 6 times per day  . ipratropium-albuterol  3 mL Nebulization Q6H  . levothyroxine  200 mcg Per Tube QAC breakfast  . pantoprazole (PROTONIX) IV  40 mg Intravenous Q12H  . sertraline  100 mg Per Tube Daily    Time spent on care of this patient: 35 mins   MCCLUNG,JEFFREY T , MD   [facility] Office  [phone] Pager - Text Page per Loretha Stapler as per below:  On-Call/Text Page:  [email or web address]      password [email or web address]  If 7PM-7AM, please contact night-coverage [email or web address] Password [email or web address] 08/04/2014, 11:58 AM   LOS: 1 day

## 2014-08-04 NOTE — Op Note (Signed)
[facility] [address] [place], [id number]   OPERATIVE PROCEDURE REPORT  PATIENT: Samantha Hubbard, Samantha Hubbard  MR#: [id number] BIRTHDATE: 07/08/1934 GENDER: female ENDOSCOPIST: Lorenza BurtonJyothi N Latajah Thuman, MD ASSISTANT:   Kandice RobinsonsGuillaume Awaka, technician, Omelia BlackwaterShelby Carpenter, RN  Dwain SarnaPatricia Ford, RN. PROCEDURE DATE: 08/04/2014 PRE-PROCEDURE PREPARATION: Patient fasted for 4 hours prior to procedure. The patient was prepped with a gallon of Golytely the night prior to the procedure via the PEG. PRE-PROCEDURE PHYSICAL: Patient has stable vital signs; she is on the vent;  There is no JVD, thyromegaly or LAD.  Chest clear to auscultation.  S1 and S2 regular.  Abdomen soft, non-distended, non-tender with NABS. PROCEDURE:     Colonoscopy, diagnostic ASA CLASS:     Class IV INDICATIONS:     1.  Hematochezia 2. Anemia 3. CRC screening. MEDICATIONS:     Fentanyl 25 mcg and Versed 2 mg IV.  DESCRIPTION OF PROCEDURE: After the risks, benefits, and alternatives of the procedure were thoroughly explained [including a 10% missed rate of cancer and polyps], informed consent was obtained.  Digital rectal exam was performed.  The [employer] Adult Colon [id number]  was introduced through the anus  and advanced to the cecum, which was identified by both the appendix and ileocecal valve , limited by No adverse events experienced.   The quality of the prep was good. . Multiple washes were done. Small lesions could be missed. The instrument was then slowly withdrawn as the colon was fully examined.     COLON FINDINGS: There was moderate diverticulosis noted throughout the entire examined colon. Two non-bleeding AVM's were noted in the cecum. No masses or polyps were noted.  The appendiceal orifice and the ICV were identified and photographed.   Retroflexed views revealed no abnormalities.  The patient tolerated the procedure without immediate complications. The scope was then withdrawn from the patient  and the procedure terminated.  TIME TO CECUM:   7 minutes 00 seconds WITHDRAW TIME:  8 minutes 00 seconds  IMPRESSION:     1.  Moderate diverticulosis was noted throughout the entire examined colon. 2.  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  RECOMMENDATIONS:     1. Continue current medications.  2. Resume tube feeds.  REPEAT EXAM:      No recall: no repeat exam planned for now.Marland Kitchen.   REFERRED BY: THP      eSigned:  Lorenza BurtonJyothi N Espiridion Supinski, MD 08/04/2014 5:46 PM ed CPT CODES:     [phone] Colonoscopy, flexible, proximal to splenic flexure; diagnostic, with or without collection of specimen(s) by brushing or washing, with or without colon decompression (separate procedure) ICD CODES:     K62.5, Rectal bleeding k57.30 Diverticulosis K55.20 Angiodysplasia of colon without hemorrhage,. Z12.11  The ICD and CPT codes recommended by this software are interpretations from the data that the clinical staff has captured with the software.  The verification of the translation of this report to the ICD and CPT codes and modifiers is the sole responsibility of the health care institution and practicing physician where this report was generated.  [employer] will not be held responsible for the validity of the ICD and CPT codes included on this report.  AMA assumes no liability for data contained or not contained herein. CPT is a Publishing rights managerregistered trademark of the [employer].  PATIENT NAME:  Samantha Hubbard, Samantha Hubbard MR#: [id number]

## 2014-08-05 LAB — CBC
HCT: 21.9 % — ABNORMAL LOW (ref 36.0–46.0)
HCT: 22.8 % — ABNORMAL LOW (ref 36.0–46.0)
HEMOGLOBIN: 7.5 g/dL — AB (ref 12.0–15.0)
Hemoglobin: 7.3 g/dL — ABNORMAL LOW (ref 12.0–15.0)
MCH: 29.8 pg (ref 26.0–34.0)
MCH: 30.2 pg (ref 26.0–34.0)
MCHC: 33.3 g/dL (ref 30.0–36.0)
MCHC: 32.9 g/dL (ref 30.0–36.0)
MCV: 89.4 fL (ref 78.0–100.0)
MCV: 91.9 fL (ref 78.0–100.0)
Platelets: 191 10*3/uL (ref 150–400)
Platelets: 181 10*3/uL (ref 150–400)
RBC: 2.45 MIL/uL — ABNORMAL LOW (ref 3.87–5.11)
RBC: 2.48 MIL/uL — AB (ref 3.87–5.11)
RDW: 17.8 % — ABNORMAL HIGH (ref 11.5–15.5)
RDW: 17.8 % — ABNORMAL HIGH (ref 11.5–15.5)
WBC: 11.3 10*3/uL — AB (ref 4.0–10.5)
WBC: 9.5 10*3/uL (ref 4.0–10.5)

## 2014-08-05 LAB — COMPREHENSIVE METABOLIC PANEL
ALT: 27 U/L (ref 0–35)
AST: 21 U/L (ref 0–37)
Albumin: 2.4 g/dL — ABNORMAL LOW (ref 3.5–5.2)
Alkaline Phosphatase: 79 U/L (ref 39–117)
Anion gap: 6 (ref 5–15)
BUN: 16 mg/dL (ref 6–23)
CO2: 26 mmol/L (ref 19–32)
Calcium: 8.7 mg/dL (ref 8.4–10.5)
Chloride: 104 mmol/L (ref 96–112)
Creatinine, Ser: 0.77 mg/dL (ref 0.50–1.10)
GFR calc Af Amer: >90 mL/min (ref >90)
GFR calc non Af Amer: 79 mL/min — ABNORMAL LOW (ref >90)
GLUCOSE: 113 mg/dL — AB (ref 70–99)
POTASSIUM: 3.2 mmol/L — AB (ref 3.5–5.1)
Sodium: 136 mmol/L (ref 135–145)
Total Bilirubin: 0.6 mg/dL (ref 0.3–1.2)
Total Protein: 4.8 g/dL — ABNORMAL LOW (ref 6.0–8.3)

## 2014-08-05 LAB — URINALYSIS, ROUTINE W REFLEX MICROSCOPIC
BILIRUBIN URINE: NEGATIVE
Glucose, UA: NEGATIVE mg/dL
Hgb urine dipstick: NEGATIVE
Ketones, ur: NEGATIVE mg/dL
LEUKOCYTES UA: NEGATIVE
Nitrite: NEGATIVE
Protein, ur: NEGATIVE mg/dL
Specific Gravity, Urine: 1.018 (ref 1.005–1.030)
Urobilinogen, UA: 1 mg/dL (ref 0.0–1.0)
pH: 6 (ref 5.0–8.0)

## 2014-08-05 LAB — GLUCOSE, CAPILLARY
GLUCOSE-CAPILLARY: 149 mg/dL — AB (ref 70–99)
Glucose-Capillary: 128 mg/dL — ABNORMAL HIGH (ref 70–99)
Glucose-Capillary: 164 mg/dL — ABNORMAL HIGH (ref 70–99)
Glucose-Capillary: 156 mg/dL — ABNORMAL HIGH (ref 70–99)
Glucose-Capillary: 189 mg/dL — ABNORMAL HIGH (ref 70–99)
Glucose-Capillary: 145 mg/dL — ABNORMAL HIGH (ref 70–99)

## 2014-08-05 MED ORDER — POTASSIUM CHLORIDE 10 MEQ/100ML IV SOLN
10.0000 meq | INTRAVENOUS | Status: DC
  Administered 2014-08-05 (×2): 10 meq via INTRAVENOUS
  Filled 2014-08-05: qty 100

## 2014-08-05 MED ORDER — OSMOLITE 1.2 CAL PO LIQD
237.0000 mL | Freq: Four times a day (QID) | ORAL | Status: DC
  Administered 2014-08-05 – 2014-08-08 (×13): 237 mL
  Filled 2014-08-05 (×22): qty 237

## 2014-08-05 MED ORDER — PANTOPRAZOLE SODIUM 40 MG PO PACK
40.0000 mg | PACK | Freq: Two times a day (BID) | ORAL | Status: DC
  Administered 2014-08-05 – 2014-08-08 (×6): 40 mg
  Filled 2014-08-05 (×8): qty 20

## 2014-08-05 MED ORDER — PREDNISONE 10 MG PO TABS
10.0000 mg | ORAL_TABLET | Freq: Two times a day (BID) | ORAL | Status: DC
  Administered 2014-08-06 – 2014-08-08 (×5): 10 mg
  Filled 2014-08-05 (×6): qty 1

## 2014-08-05 MED ORDER — VITAL HIGH PROTEIN PO LIQD: 1000.0000 mL | ORAL | Status: DC

## 2014-08-05 MED ORDER — PRO-STAT SUGAR FREE PO LIQD
30.0000 mL | Freq: Every morning | ORAL | Status: DC
Start: 2014-08-05 — End: 2014-08-08
  Administered 2014-08-05 – 2014-08-08 (×4): 30 mL
  Filled 2014-08-05 (×5): qty 30

## 2014-08-05 NOTE — Progress Notes (Signed)
TEAM 1 - Stepdown/ICU TEAM Progress Note  Jameeka Marcy VHQ:[id number] DOB: [birth year] DOA: 08/03/2014 PCP: Verneita Griffes, MD  Admit HPI / Brief Narrative: 80 year old female who underwent a maze procedure and aortic valve replacement in the fall of 2015 and had postoperative course complicated by ventilator-dependent respiratory failure who comes to [facility] on 08/03/2014 from [facility] with a lower GI bleed. She had been hospitalized in December 2015 through January 2016 with a GI bleed and gram-negative rod bacteremia. During that time she had an endoscopy which showed chronic gastritis. She had been treated previously with full dose anticoagulation because of paroxysmal A fib. She was discharged to [facility] on 07/16/2014. Apparently she did well initially and by January 29 she had weaned off the ventilator temporarily and was able to use a Passy-Muir valve and drink some fluids. She had been participating in physical therapy and had been up to a chair. However, on 08/03/2013 she developed the sudden onset of lower GI bleeding. Apparently she had 4 bloody stools at [facility]. In the [facility] ED she was hemodynamically stable and her hemoglobin was 7.6. She was complaining of very mild pain in her left lower quadrant.  HPI/Subjective: Pt is resting comfortably in bed.  She is alert.  She denies abdom pain or cp.  She c/o dry mouth and asks to be allowed occasional ice chips.    Assessment/Plan:  LGIB Recent EGD noted chronic gastritis - GI following - colonoscopy noted diverticula and AVMs but no active bleeding   Acute on chronic blood loss anemia  Transfused 1U PRBC thus far - Hgb nadir of 6.5 - s/p 1U PRBC thus far - now drifting back down suggesting ongoing loss - transfuse prn Hgb <7.0 - follow trend   Mild hypotension BP has improved / stabilized - follow w/ ongoing bleeding   Chronic prednisone, presumably due to COPD > Relative adrenal insufficiency Cont  to taper stress dose steroids   Chronic respiratory failure with ventilator dependence secondary to COPD Tracheostomy placed October 2015 - stable on chronic vent settings at this time - attempt to wean this morning was met rapidly w/ tachypnea to 50bpm - weaning efforts aborted - pt stable and comfortable on vent support   Hyponatremia  ?simply due to volume loss - improved/normalized w/ volume resuscitation - follow trend   Hypokalemia  Replace and follow - check Mg  Paroxysmal A. Fib NSR at present - follow on tele until Hgb stable   Status post Maze procedure & bovine aortic valve replacement October 2015  DM CBG currently well controlled   Protein-calorie malnutrition chronically fed through PEG tube - tube clogged this morning, but RN was able to open - avoid KCl per tube for now   Code Status: FULL Family Communication: no family present at time of exam   Disposition Plan: SDU  Consultants: [facility]  Procedures: Colo - 2/6 - moderate diverticulosis - 2 non-bleeding AVMs in the cecum   Antibiotics: none  DVT prophylaxis: SCDs  Objective: Blood pressure 116/65, pulse 78, temperature 98.8 F (37.1 C), temperature source Oral, resp. rate 21, weight 61.4 kg (135 lb 5.8 oz), SpO2 100 %.  Intake/Output Summary (Last 24 hours) at 08/05/14 1135 Last data filed at 08/05/14 0700  Gross per 24 hour  Intake 1381.17 ml  Output    200 ml  Net 1181.17 ml   Exam: General: No acute respiratory distress - alert and conversant on vent  Lungs: Clear to auscultation  bilaterally without wheezes or crackles Cardiovascular: Regular rate and rhythm without murmur gallop or rub Abdomen: Nontender, nondistended, soft, bowel sounds positive, no rebound, no ascites, no appreciable mass Extremities: No significant cyanosis, clubbing, edema bilateral lower extremities  Data Reviewed: Basic Metabolic Panel:  Recent Labs Lab 08/03/14 0550 08/03/14 1609 08/04/14 0306  08/05/14 0417  NA 127* 127* 132* 136  K 4.9 4.7 4.1 3.2*  CL 91* 91* 98 104  CO2 $Re'30 31 26 26  'sCN$ GLUCOSE 233* 110* 122* 113*  BUN 30* 28* 27* 16  CREATININE 0.78 0.76 0.77 0.77  CALCIUM 9.2 8.9 8.7 8.7  MG  --   --  2.2  --   PHOS  --   --  4.5  --     Liver Function Tests:  Recent Labs Lab 08/05/14 0417  AST 21  ALT 27  ALKPHOS 79  BILITOT 0.6  PROT 4.8*  ALBUMIN 2.4*   Coags:  Recent Labs Lab 08/03/14 0550  INR 1.06   CBC:  Recent Labs Lab 08/03/14 0550  08/03/14 2338 08/04/14 0306 08/04/14 1105 08/04/14 1850 08/05/14 0417  WBC 11.9*  < > 13.7* 12.4* 10.5 13.2* 11.3*  NEUTROABS 10.2*  --   --   --   --   --   --   HGB 7.2*  < > 8.2* 8.0* 7.7* 8.3* 7.3*  HCT 22.8*  < > 24.4* 24.2* 23.4* 24.4* 21.9*  MCV 91.6  < > 88.4 89.0 88.3 86.5 89.4  PLT 212  < > 189 182 185 184 191  < > = values in this interval not displayed. CBG:  Recent Labs Lab 08/04/14 1653 08/04/14 1942 08/05/14 0202 08/05/14 0429 08/05/14 0808  GLUCAP 85 95 149* 128* 164*    Recent Results (from the past 240 hour(s))  Culture, blood (routine x 2)     Status: None (Preliminary result)   Collection Time: 08/03/14  1:00 PM  Result Value Ref Range Status   Specimen Description BLOOD LEFT ANTECUBITAL  Final   Special Requests BOTTLES DRAWN AEROBIC AND ANAEROBIC 10CC  Final   Culture   Final           BLOOD CULTURE RECEIVED NO GROWTH TO DATE CULTURE WILL BE HELD FOR 5 DAYS BEFORE ISSUING A FINAL NEGATIVE REPORT Performed at [facility]    Report Status PENDING  Incomplete  Culture, blood (routine x 2)     Status: None (Preliminary result)   Collection Time: 08/03/14  1:10 PM  Result Value Ref Range Status   Specimen Description BLOOD LEFT HAND  Final   Special Requests BOTTLES DRAWN AEROBIC ONLY [place]  Final   Culture   Final           BLOOD CULTURE RECEIVED NO GROWTH TO DATE CULTURE WILL BE HELD FOR 5 DAYS BEFORE ISSUING A FINAL NEGATIVE REPORT Performed at [facility]    Report Status PENDING  Incomplete  Clostridium Difficile by PCR     Status: None   Collection Time: 08/04/14  1:03 AM  Result Value Ref Range Status   C difficile by pcr NEGATIVE NEGATIVE Final     Studies:  Recent x-ray studies have been reviewed in detail by the Attending Physician  Scheduled Meds:  Scheduled Meds: . ALPRAZolam  0.25 mg Per Tube BID  . amiodarone  200 mg Per Tube BID  . antiseptic oral rinse  15 mL Mouth Rinse QID  . atorvastatin  40 mg Per Tube Daily  .  budesonide  0.5 mg Nebulization BID  . carvedilol  3.125 mg Per Tube BID WC  . chlorhexidine  15 mL Mouth/Throat BID  . cholecalciferol  2,000 Units Oral Daily  . feeding supplement (OSMOLITE 1.2 CAL)  237 mL Per Tube QID  . feeding supplement (PRO-STAT SUGAR FREE 64)  30 mL Per Tube q morning - 10a  . hydrocortisone sodium succinate  25 mg Intravenous Q12H  . insulin aspart  0-15 Units Subcutaneous 6 times per day  . ipratropium-albuterol  3 mL Nebulization QID  . levothyroxine  200 mcg Per Tube QAC breakfast  . pantoprazole (PROTONIX) IV  40 mg Intravenous Q12H  . pneumococcal 23 valent vaccine  0.5 mL Intramuscular Tomorrow-1000  . sertraline  100 mg Per Tube Daily    Time spent on care of this patient: 35 mins   Bethlehem Langstaff T , MD   [facility] Office  [phone] Pager - Text Page per Shea Evans as per below:  On-Call/Text Page:      [email or web address]      password [email or web address]  If 7PM-7AM, please contact night-coverage [email or web address] Password [email or web address] 08/05/2014, 11:35 AM   LOS: 2 days

## 2014-08-05 NOTE — Progress Notes (Signed)
Cross cover [facility] Subjective: Since I last evaluated the patient, she has been stable. There has been no further rectal bleeding. She denies having any abdominal pain, nausea, melena or hematochezia.   Objective: Vital signs in last 24 hours: Temp:  [98.4 F (36.9 C)-99.5 F (37.5 C)] 99 F (37.2 C) (02/07 1200) Pulse Rate:  [77-84] 77 (02/07 1200) Resp:  [14-29] 29 (02/07 1200) BP: (105-143)/(36-82) 143/48 mmHg (02/07 1200) SpO2:  [99 %-100 %] 100 % (02/07 1200) FiO2 (%):  [30 %] 30 % (02/07 1200) Weight:  [61.4 kg (135 lb 5.8 oz)] 61.4 kg (135 lb 5.8 oz) (02/07 0430) Last BM Date: 08/04/14  Intake/Output from previous day: 02/06 0701 - 02/07 0700 In: 1581.2 [I.V.:1090; NG/GT:491.2] Out: 202 [Urine:201; Stool:1] Intake/Output this shift:   General appearance: alert, cooperative, appears stated age and no distress; on a vent Resp: clear to auscultation bilaterally Cardio: regular rate and rhythm, S1, S2 normal, no murmur, click, rub or gallop GI: soft, non-tender; bowel sounds normal; no masses,  no organomegaly  Lab Results:  Recent Labs  08/04/14 1105 08/04/14 1850 08/05/14 0417  WBC 10.5 13.2* 11.3*  HGB 7.7* 8.3* 7.3*  HCT 23.4* 24.4* 21.9*  PLT 185 184 191   BMET  Recent Labs  08/03/14 1609 08/04/14 0306 08/05/14 0417  NA 127* 132* 136  K 4.7 4.1 3.2*  CL 91* 98 104  CO2 31 26 26   GLUCOSE 110* 122* 113*  BUN 28* 27* 16  CREATININE 0.76 0.77 0.77  CALCIUM 8.9 8.7 8.7   LFT  Recent Labs  08/05/14 0417  PROT 4.8*  ALBUMIN 2.4*  AST 21  ALT 27  ALKPHOS 79  BILITOT 0.6   PT/INR  Recent Labs  08/03/14 0550  LABPROT 13.9  INR 1.06   Medications: I have reviewed the patient's current medications.  Assessment/Plan: 1) S/P recurrent GI bleeding-aortic stenosis with cecal AVM's-non-bleeding at the time of the colonoscopy. I suspect she may have small bowel AVM's as well. Continue present care. Please call as needed.  2) Pandiverticulosis.   3) Feeding difficulties. 4) Hyoponatremia-improved. 5) S/P Respiratory failure; chronic vent. dependency.  LOS: 2 days   Karver Fadden 08/05/2014, 12:48 PM

## 2014-08-05 NOTE — Progress Notes (Signed)
Brief Nutrition Note  Consult received for enteral/tube feeding initiation and management.  Adult Enteral Nutrition Protocol initiated based on recommendations provided on 2/5. Full assessment to follow.  Admitting Dx: Hyponatremia [E87.1] Rectal bleeding [K62.5] Anemia, unspecified anemia type [D64.9]  Body mass index is 24.75 kg/(m^2). Pt meets criteria for normal range based on current BMI.  Labs:   Recent Labs Lab 08/03/14 1609 08/04/14 0306 08/05/14 0417  NA 127* 132* 136  K 4.7 4.1 3.2*  CL 91* 98 104  CO2 31 26 26   BUN 28* 27* 16  CREATININE 0.76 0.77 0.77  CALCIUM 8.9 8.7 8.7  MG  --  2.2  --   PHOS  --  4.5  --   GLUCOSE 110* 122* 113*    Samantha FrancoLindsey Christan Ciccarelli, MS, RD, LDN Pager: [phone] After Hours Pager: [phone]

## 2014-08-05 NOTE — Clinical Social Work Psychosocial (Signed)
Clinical Social Work Department BRIEF PSYCHOSOCIAL ASSESSMENT 08/05/2014  Patient:  Samantha Hubbard,Samantha Hubbard     Account Number:  [id number]     Admit date:  08/03/2014  Clinical Social Worker:  Lavell LusterAMPBELL,Orlie Cundari BRYANT, LCSWA  Date/Time:  08/05/2014 02:17 PM  Referred by:  Physician  Date Referred:  08/05/2014 Referred for  SNF Placement   Other Referral:   NA   Interview type:  Family Other interview type:   Patient's son inteviewed by phone to complete assessment as no family at bedside at time of assessment.    PSYCHOSOCIAL DATA Living Status:  FACILITY Admitted from facility:  Other Level of care:  Skilled Nursing Facility Primary support name:  Samantha Hubbard Primary support relationship to patient:  CHILD, ADULT Degree of support available:   Patient has good family support.    CURRENT CONCERNS Current Concerns  Post-Acute Placement   Other Concerns:   NA    SOCIAL WORK ASSESSMENT / PLAN CSW unable to complete assessment at bedside as no family present. CSW spoke with patient's son Samantha Hubbard by phone to complete assessment. John reports that [facility] bleed and shows good understanding of how this is impacting the patient.    CSW inquired about patient's history with [facility]. Samantha Hubbard states that this all started back in August when she had open heart surgery. Per son, there were many complications from the surgery which resulted in the patient going to [facility]. From [facility], the patient was discharge to [facility], and since being at [facility] she has [facility]. John expresses feelings of loss as he describes the decline of his mother's health. He states, "Prior to this operation, she was able to live 100% independently and was doing well, but she had to have this surgery, and unfortunately had complications." Samantha Hubbard seems worn by long road to recovery that his mom (the patient) is going through.    John states that he  wants the patient to return to [facility] once medically stable. CSW explained role and process of getting patient back to facility once medically ready. CSW will assist.   Assessment/plan status:  Psychosocial Support/Ongoing Assessment of Needs Other assessment/ plan:   Complete Fl2, Fax, PASRR   Information/referral to community resources:   CSW contact information given.    PATIENT'S/FAMILY'S RESPONSE TO PLAN OF CARE: Patient's son Samantha NeedleMichael wants patient to return to [facility] SNF once medically stable. Overall Samantha Hubbard was engaged in assessment, calm, and appreciative of CSW assistance.       Roddie McBryant Calyse Murcia MSW, ProtivinLCSWA, [place], [phone]

## 2014-08-06 ENCOUNTER — Encounter (HOSPITAL_COMMUNITY): Payer: Self-pay | Admitting: Gastroenterology

## 2014-08-06 LAB — MAGNESIUM: Magnesium: 2 mg/dL (ref 1.5–2.5)

## 2014-08-06 LAB — CBC
HCT: 22.2 % — ABNORMAL LOW (ref 36.0–46.0)
HEMOGLOBIN: 7.1 g/dL — AB (ref 12.0–15.0)
MCH: 29.7 pg (ref 26.0–34.0)
MCHC: 32 g/dL (ref 30.0–36.0)
MCV: 92.9 fL (ref 78.0–100.0)
Platelets: 173 10*3/uL (ref 150–400)
RBC: 2.39 MIL/uL — AB (ref 3.87–5.11)
RDW: 17.8 % — ABNORMAL HIGH (ref 11.5–15.5)
WBC: 10 10*3/uL (ref 4.0–10.5)

## 2014-08-06 LAB — COMPREHENSIVE METABOLIC PANEL
ALBUMIN: 2.2 g/dL — AB (ref 3.5–5.2)
ALK PHOS: 73 U/L (ref 39–117)
ALT: 24 U/L (ref 0–35)
AST: 25 U/L (ref 0–37)
Anion gap: 7 (ref 5–15)
BILIRUBIN TOTAL: 0.4 mg/dL (ref 0.3–1.2)
BUN: 20 mg/dL (ref 6–23)
CO2: 24 mmol/L (ref 19–32)
Calcium: 8.7 mg/dL (ref 8.4–10.5)
Chloride: 104 mmol/L (ref 96–112)
Creatinine, Ser: 0.79 mg/dL (ref 0.50–1.10)
GFR, EST NON AFRICAN AMERICAN: 78 mL/min — AB (ref >90)
GLUCOSE: 156 mg/dL — AB (ref 70–99)
Potassium: 3.4 mmol/L — ABNORMAL LOW (ref 3.5–5.1)
Sodium: 135 mmol/L (ref 135–145)
TOTAL PROTEIN: 4.7 g/dL — AB (ref 6.0–8.3)

## 2014-08-06 LAB — GLUCOSE, CAPILLARY
GLUCOSE-CAPILLARY: 162 mg/dL — AB (ref 70–99)
Glucose-Capillary: 107 mg/dL — ABNORMAL HIGH (ref 70–99)
Glucose-Capillary: 123 mg/dL — ABNORMAL HIGH (ref 70–99)

## 2014-08-06 NOTE — Progress Notes (Signed)
CSW receive a call from [place] [facility] requesting an update on [facility]'s condition.  Currently has a vacancy for patient and will accept back when medically stable.  CSW will need to monitor daily for bed availability/stability.  Lorri Frederickonna T. Jaci LazierCrowder, [place] [phone] (coverage)

## 2014-08-06 NOTE — Progress Notes (Addendum)
[place] TEAM 1 - Stepdown/ICU TEAM Progress Note  Samantha Hubbard TDS:[id number] DOB: Oct 17, 1934 DOA: 08/03/2014 PCP: Verneita Griffes, MD  Admit HPI / Brief Narrative: 80 year old female who underwent a maze procedure and aortic valve replacement in the fall of 2015 and had postoperative course complicated by ventilator-dependent respiratory failure who came to [facility] on 08/03/2014 from [facility] with a lower GI bleed. She had been hospitalized in December 2015 through January 2016 with a GI bleed and gram-negative rod bacteremia. During that time she had an endoscopy which showed chronic gastritis. She had been treated previously with full dose anticoagulation because of paroxysmal A fib. She was discharged to [facility] on 07/16/2014. Apparently she did well initially and by January 29 she had weaned off the ventilator temporarily and was able to use a Passy-Muir valve and drink some fluids. She had been participating in physical therapy and had been up to a chair. However on 08/03/2013 she developed the sudden onset of lower GI bleeding. Apparently she had 4 bloody stools at [facility]. In the [facility] ED she was hemodynamically stable and her hemoglobin was 7.6. She was complaining of very mild pain in her left lower quadrant.   HPI/Subjective: She is alert.  She denies abdom pain or cp.  She is very interested in determining if she can drink or even eat.      Assessment/Plan:  LGIB Recent EGD noted chronic gastritis - GI following - colonoscopy noted diverticula and AVMs but no active bleeding - unclear at this time if bleeding continues - cont to follow stools and Hgb - was on ASA at admit, now on hold   Acute on chronic blood loss anemia  Transfused 1U PRBC thus far - Hgb nadir of 6.5 - s/p 1U PRBC thus far - Hgb up and down - transfuse prn Hgb <7.0 - follow trend   Mild hypotension BP has improved / stabilized - follow   Chronic prednisone, presumably due to COPD >  Relative adrenal insufficiency Cont to taper stress dose steroids   Chronic respiratory failure with ventilator dependence secondary to COPD Tracheostomy placed October 2015 - stable on chronic vent settings at this time - attempt to wean 2/7 morning was met rapidly w/ tachypnea to 50bpm - weaning efforts aborted - pt stable and comfortable on vent support - SLP eval for swallowing purposes   Hyponatremia  ?simply due to volume loss - improved/normalized w/ volume resuscitation - follow trend   Hypokalemia  Replace and follow - Mg normal   Paroxysmal A. Fib NSR at present - follow on tele until Hgb stable   Status post Maze procedure & bovine aortic valve replacement October 2015  DM CBG currently well controlled   Protein-calorie malnutrition chronically fed through PEG tube - SLP to eval swallow   Code Status: FULL Family Communication: no family present at time of exam   Disposition Plan: SDU due to chronic vent support   Consultants: [facility]  Procedures: Colo - 2/6 - moderate diverticulosis - 2 non-bleeding AVMs in the cecum   Antibiotics: none  DVT prophylaxis: SCDs  Objective: Blood pressure 135/49, pulse 69, temperature 98.1 F (36.7 C), temperature source Oral, resp. rate 28, weight 62.5 kg (137 lb 12.6 oz), SpO2 100 %.  Intake/Output Summary (Last 24 hours) at 08/06/14 1039 Last data filed at 08/06/14 0600  Gross per 24 hour  Intake   2350 ml  Output    200 ml  Net   2150 ml  Exam: General: No acute respiratory distress - alert and conversant on vent  Lungs: Clear to auscultation bilaterally without wheezes or crackles Cardiovascular: Regular rate and rhythm without murmur gallop rub Abdomen: Nontender, nondistended, soft, bowel sounds positive, no rebound, no ascites, no appreciable mass Extremities: No significant cyanosis, clubbing, or edema bilateral lower extremities  Data Reviewed: Basic Metabolic Panel:  Recent Labs Lab 08/03/14 0550  08/03/14 1609 08/04/14 0306 08/05/14 0417 08/06/14 0415  NA 127* 127* 132* 136 135  K 4.9 4.7 4.1 3.2* 3.4*  CL 91* 91* 98 104 104  CO2 _0 GLUCOSE 233* 110* 122* 113* 156*  BUN 30* 28* 27* 16 20  CREATININE 0.78 0.76 0.77 0.77 0.79  CALCIUM 9.2 8.9 8.7 8.7 8.7  MG  --   --  2.2  --  2.0  PHOS  --   --  4.5  --   --     Liver Function Tests:  Recent Labs Lab 08/05/14 0417 08/06/14 0415  AST 21 25  ALT 27 24  ALKPHOS 79 73  BILITOT 0.6 0.4  PROT 4.8* 4.7*  ALBUMIN 2.4* 2.2*   Coags:  Recent Labs Lab 08/03/14 0550  INR 1.06   CBC:  Recent Labs Lab 08/03/14 0550  08/04/14 1105 08/04/14 1850 08/05/14 0417 08/05/14 1825 08/06/14 0415  WBC 11.9*  < > 10.5 13.2* 11.3* 9.5 10.0  NEUTROABS 10.2*  --   --   --   --   --   --   HGB 7.2*  < > 7.7* 8.3* 7.3* 7.5* 7.1*  HCT 22.8*  < > 23.4* 24.4* 21.9* 22.8* 22.2*  MCV 91.6  < > 88.3 86.5 89.4 91.9 92.9  PLT 212  < > 185 184 191 181 173  < > = values in this interval not displayed. CBG:  Recent Labs Lab 08/05/14 1238 08/05/14 1641 08/05/14 2034 08/05/14 2358 08/06/14 0407  GLUCAP 156* 189* 145* 123* 162*    Recent Results (from the past 240 hour(s))  Culture, blood (routine x 2)     Status: None (Preliminary result)   Collection Time: 08/03/14  1:00 PM  Result Value Ref Range Status   Specimen Description BLOOD LEFT ANTECUBITAL  Final   Special Requests BOTTLES DRAWN AEROBIC AND ANAEROBIC 10CC  Final   Culture   Final           BLOOD CULTURE RECEIVED NO GROWTH TO DATE CULTURE WILL BE HELD FOR 5 DAYS BEFORE ISSUING A FINAL NEGATIVE REPORT Performed at [facility]    Report Status PENDING  Incomplete  Culture, blood (routine x 2)     Status: None (Preliminary result)   Collection Time: 08/03/14  1:10 PM  Result Value Ref Range Status   Specimen Description BLOOD LEFT HAND  Final   Special Requests BOTTLES DRAWN AEROBIC ONLY [place]  Final   Culture   Final           BLOOD CULTURE  RECEIVED NO GROWTH TO DATE CULTURE WILL BE HELD FOR 5 DAYS BEFORE ISSUING A FINAL NEGATIVE REPORT Performed at [facility]    Report Status PENDING  Incomplete  Clostridium Difficile by PCR     Status: None   Collection Time: 08/04/14  1:03 AM  Result Value Ref Range Status   C difficile by pcr NEGATIVE NEGATIVE Final  Culture, respiratory (NON-Expectorated)     Status: None (Preliminary result)   Collection Time: 08/05/14  4:49 PM  Result  Value Ref Range Status   Specimen Description TRACHEAL ASPIRATE  Final   Special Requests Immunocompromised  Final   Gram Stain PENDING  Incomplete   Culture   Final    Culture reincubated for better growth Performed at [facility]    Report Status PENDING  Incomplete     Studies:  Recent x-ray studies have been reviewed in detail by the Attending Physician  Scheduled Meds:  Scheduled Meds: . ALPRAZolam  0.25 mg Per Tube BID  . amiodarone  200 mg Per Tube BID  . antiseptic oral rinse  15 mL Mouth Rinse QID  . atorvastatin  40 mg Per Tube Daily  . budesonide  0.5 mg Nebulization BID  . carvedilol  3.125 mg Per Tube BID WC  . chlorhexidine  15 mL Mouth/Throat BID  . cholecalciferol  2,000 Units Oral Daily  . feeding supplement (OSMOLITE 1.2 CAL)  237 mL Per Tube QID  . feeding supplement (PRO-STAT SUGAR FREE 64)  30 mL Per Tube q morning - 10a  . insulin aspart  0-15 Units Subcutaneous 6 times per day  . ipratropium-albuterol  3 mL Nebulization QID  . levothyroxine  200 mcg Per Tube QAC breakfast  . pantoprazole sodium  40 mg Per Tube BID  . pneumococcal 23 valent vaccine  0.5 mL Intramuscular Tomorrow-1000  . predniSONE  10 mg Per Tube BID  . sertraline  100 mg Per Tube Daily    Time spent on care of this patient: 35 mins   MCCLUNG,JEFFREY T , MD   [facility] Office  [phone] Pager - Text Page per Shea Evans as per below:  On-Call/Text Page:      [email or web address]      password [email or web address]  If 7PM-7AM, please  contact night-coverage [email or web address] Password [email or web address] 08/06/2014, 10:39 AM   LOS: 3 days

## 2014-08-06 NOTE — Progress Notes (Signed)
SLP Note  Patient Details Name: Samantha BoronJocelyn Denault MRN: [id number] DOB: 01/25/1935  Orders received for BSE. [facility] but is currently vent dependent. Use of PMSV in line with vent would be optimal for swallow evaluation, if appropriate. Please advise.  Celia B. Murvin NatalBueche, Union Hospital Of Cecil CountyMSP, CCC-SLP [phone] Leigh AuroraBueche, Celia Brown 08/06/2014, 11:59 AM

## 2014-08-06 NOTE — Progress Notes (Signed)
NUTRITION FOLLOW UP  INTERVENTION: Continue bolus feedings of one can (237 ml) of Osmolite 1.2 4 times daily with 30 ml Prostat daily per PEG.  Tube feeding regimen provides 1240 kcal (93% of needs), 68 grams of protein, and 780 ml of H2O.   Once IV fluids are discontinued, provide 100 ml free water flushes 4 times daily.  NUTRITION DIAGNOSIS: Inadequate oral intake related to inability to eat as evidenced by NPO; ongoing  Goal: Pt will meet >90% of estimated nutritional needs; met  Monitor:  Bolus TF tolerance, respiratory status, labs, weight changes, I/O's  80 y.o. female  Admitting Dx: <principal problem not specified>   80 year old female with chronic respiratory failure post valve replacement in 2015 admitted on 08/03/2014 to [facility] with a lower GI bleed.  ASSESSMENT: Patient is currently intubated on ventilator support MV: 10 L/min Temp (24hrs), Avg:98.9 F (37.2 C), Min:98.1 F (36.7 C), Max:99.4 F (37.4 C)  2/5-Pt admitted with LGIB. She has been transferred from [place]. Spoke with nurse who confirmed that pt with chronic trach and PEG. She revealed that pt was weaned off vent at [place] last week, but is currently on the vent now until condition improves. Pt continued to receive TF PTA, however, pt was cleared for PO intake by speech therapy PTA, although intake is very minimal. Diet PTA was thin liquids with soft diet. Son at bedside reports generally eats foods such as eggs and applesauce and tolerates well, however, also confirms minimal PO intake. Pt tires easily.  RN reports that pt will be NPO for colonoscopy and feedings will also be help at this time. Feedings will be resumed once GI work-up is completed. Home TF regimen is bolus feedings of 240 ml of Nutren every 6 hours, 74 ml of protein supplement daily, and 300 ml free water flush every 4 hours. Total regimen provides 1920 kcal, 77 g protein and 2462 ml of fluid.   2/8: RD consulted for  enteral/tube feeding initiation and management. Pt is currently NPO. Tube feeding orders have been put in place. Pt was been tolerating her feeds well with no difficulties.   Labs and medications reviewed.  Height: Ht Readings from Last 1 Encounters:  07/10/14 _0  (1.575 m)    Weight: Wt Readings from Last 1 Encounters:  08/06/14 137 lb 12.6 oz (62.5 kg)    BMI:  Estimated body mass index is 25.2 kg/(m^2) as calculated from the following:   Height as of 07/10/14: _1  (1.575 m).   Weight as of this encounter: 137 lb 12.6 oz (62.5 kg). Normal weight range  Re-Estimated Nutritional Needs: Kcal: 1328 Protein: 67-77 grams Fluid: >1.5 L  Skin: DTI on rt buttocks  Diet Order: Diet NPO time specified Except for: Ice ChipsNPO  EDUCATION NEEDS: -Education not appropriate at this time   Intake/Output Summary (Last 24 hours) at 08/06/14 1007 Last data filed at 08/06/14 0600  Gross per 24 hour  Intake   2350 ml  Output    200 ml  Net   2150 ml    Last BM: 2/8  Labs:   Recent Labs Lab 08/04/14 0306 08/05/14 0417 08/06/14 0415  NA 132* 136 135  K 4.1 3.2* 3.4*  CL 98 104 104  CO2 _2 BUN 27* 16 20  CREATININE 0.77 0.77 0.79  CALCIUM 8.7 8.7 8.7  MG 2.2  --  2.0  PHOS 4.5  --   --   GLUCOSE 122* 113* 156*  CBG (last 3)   Recent Labs  08/05/14 2034 08/05/14 2358 08/06/14 0407  GLUCAP 145* 123* 162*    Scheduled Meds: . ALPRAZolam  0.25 mg Per Tube BID  . amiodarone  200 mg Per Tube BID  . antiseptic oral rinse  15 mL Mouth Rinse QID  . atorvastatin  40 mg Per Tube Daily  . budesonide  0.5 mg Nebulization BID  . carvedilol  3.125 mg Per Tube BID WC  . chlorhexidine  15 mL Mouth/Throat BID  . cholecalciferol  2,000 Units Oral Daily  . feeding supplement (OSMOLITE 1.2 CAL)  237 mL Per Tube QID  . feeding supplement (PRO-STAT SUGAR FREE 64)  30 mL Per Tube q morning - 10a  . insulin aspart  0-15 Units Subcutaneous 6 times per day  .  ipratropium-albuterol  3 mL Nebulization QID  . levothyroxine  200 mcg Per Tube QAC breakfast  . pantoprazole sodium  40 mg Per Tube BID  . pneumococcal 23 valent vaccine  0.5 mL Intramuscular Tomorrow-1000  . predniSONE  10 mg Per Tube BID  . sertraline  100 mg Per Tube Daily    Continuous Infusions: . sodium chloride 50 mL/hr at 08/05/14 2217    Past Medical History  Diagnosis Date  . Acute respiratory failure with hypoxia and hypercapnia   . Ventilator dependent   . Healthcare-associated pneumonia   . Pneumonia, Klebsiella   . Left ventricular diastolic dysfunction   . Chronic pain syndrome   . Critical illness myopathy   . Severe protein-calorie malnutrition   . Dysphagia   . COPD (chronic obstructive pulmonary disease)     emphysema.   . Anemia of chronic disease     acute blood loss anemia 06/2014, received 2 units PRBCs  . Thyroid disease     Hypothyroidism  . Depression with anxiety   . Hypertension   . Thrush   . Hypernatremia   . Paroxysmal a-fib 10/15    status post Maze   . Aortic stenosis 10/15    status post AV replacement   . Acute renal failure (ARF) 03/2014    ARF reuiring hemodialysis post AVR  . Hyperglycemia   . Peripheral vascular disease   . Chronic low back pain   . Hyperlipemia   . Allergic rhinitis     Past Surgical History  Procedure Laterality Date  . Esophagogastroduodenoscopy N/A 07/10/2014    Procedure: ESOPHAGOGASTRODUODENOSCOPY (EGD);  Surgeon: Inda Castle, MD;  Location: [facility];  Service: Endoscopy;  Laterality: N/A;  . Aortic valve replacement  03/2014    at [place].   . Maze  03/2014    [place] for critical AS  . Peg placement  04/2014    [facility]  . Esophagogastroduodenoscopy  05/2014    for GIB. clipped ulcer laying beneath peg bumper. at [place]  . Tracheostomy  04/2014    at [place].   . Colonoscopy N/A 08/04/2014    Procedure: COLONOSCOPY;  Surgeon: Juanita Craver, MD;  Location: [facility] ENDOSCOPY;  Service:  Endoscopy;  Laterality: N/A;    Kallie Locks, MS, RD, LDN Pager # [phone] After hours/ weekend pager # [phone]

## 2014-08-06 NOTE — Progress Notes (Signed)
Pt c/o feeling like she couldn't breath. O2 sat 100%, RR 24, lungs sounds rhonchus and weezing upon auscultation. Thick white/tan sputum suctioned both orally and inline. Benedetto Coons. [place] notified of patient status. Ordered to give 8am scheduled nebulizer treatments early. RT notified of orders. Patient resting at this time will continue to monitor.

## 2014-08-07 DIAGNOSIS — I480 Paroxysmal atrial fibrillation: Secondary | ICD-10-CM

## 2014-08-07 DIAGNOSIS — E876 Hypokalemia: Secondary | ICD-10-CM

## 2014-08-07 DIAGNOSIS — D62 Acute posthemorrhagic anemia: Secondary | ICD-10-CM

## 2014-08-07 DIAGNOSIS — E871 Hypo-osmolality and hyponatremia: Secondary | ICD-10-CM

## 2014-08-07 DIAGNOSIS — I9589 Other hypotension: Secondary | ICD-10-CM

## 2014-08-07 DIAGNOSIS — E46 Unspecified protein-calorie malnutrition: Secondary | ICD-10-CM

## 2014-08-07 DIAGNOSIS — E119 Type 2 diabetes mellitus without complications: Secondary | ICD-10-CM

## 2014-08-07 LAB — TYPE AND SCREEN
ABO/RH(D): O POS
Antibody Screen: NEGATIVE
UNIT DIVISION: 0
Unit division: 0

## 2014-08-07 LAB — GLUCOSE, CAPILLARY
GLUCOSE-CAPILLARY: 116 mg/dL — AB (ref 70–99)
GLUCOSE-CAPILLARY: 160 mg/dL — AB (ref 70–99)
GLUCOSE-CAPILLARY: 142 mg/dL — AB (ref 70–99)
Glucose-Capillary: 121 mg/dL — ABNORMAL HIGH (ref 70–99)
Glucose-Capillary: 143 mg/dL — ABNORMAL HIGH (ref 70–99)
Glucose-Capillary: 175 mg/dL — ABNORMAL HIGH (ref 70–99)
Glucose-Capillary: 231 mg/dL — ABNORMAL HIGH (ref 70–99)
Glucose-Capillary: 92 mg/dL (ref 70–99)

## 2014-08-07 LAB — BASIC METABOLIC PANEL
Anion gap: 4 — ABNORMAL LOW (ref 5–15)
BUN: 22 mg/dL (ref 6–23)
CALCIUM: 8.7 mg/dL (ref 8.4–10.5)
CO2: 27 mmol/L (ref 19–32)
Chloride: 104 mmol/L (ref 96–112)
Creatinine, Ser: 0.79 mg/dL (ref 0.50–1.10)
GFR calc Af Amer: >90 mL/min (ref >90)
GFR calc non Af Amer: 78 mL/min — ABNORMAL LOW (ref >90)
GLUCOSE: 159 mg/dL — AB (ref 70–99)
Potassium: 3.6 mmol/L (ref 3.5–5.1)
SODIUM: 135 mmol/L (ref 135–145)

## 2014-08-07 LAB — CBC
HCT: 23.7 % — ABNORMAL LOW (ref 36.0–46.0)
HEMOGLOBIN: 7.6 g/dL — AB (ref 12.0–15.0)
MCH: 29.9 pg (ref 26.0–34.0)
MCHC: 32.1 g/dL (ref 30.0–36.0)
MCV: 93.3 fL (ref 78.0–100.0)
Platelets: 173 10*3/uL (ref 150–400)
RBC: 2.54 MIL/uL — ABNORMAL LOW (ref 3.87–5.11)
RDW: 17.4 % — ABNORMAL HIGH (ref 11.5–15.5)
WBC: 11.5 10*3/uL — ABNORMAL HIGH (ref 4.0–10.5)

## 2014-08-07 LAB — PREPARE RBC (CROSSMATCH)

## 2014-08-07 MED ORDER — SODIUM CHLORIDE 0.9 % IV SOLN
Freq: Once | INTRAVENOUS | Status: AC
  Administered 2014-08-07: 19:00:00 via INTRAVENOUS

## 2014-08-07 NOTE — Progress Notes (Signed)
[name] - Stepdown/ICU TEAM Progress Note  Samantha Hubbard OZH:[id number] DOB: 12-03-1934 DOA: 08/03/2014 PCP: Samantha Bow, MD  Admit HPI / Brief Narrative: 80 year old WF PMHx maze procedure and aortic valve replacement in the fall of 2015 and had postoperative course complicated by ventilator-dependent respiratory failure who comes to [facility] on 08/03/2014 from [facility] with a lower GI bleed. She had been hospitalized in December 2015 through January 2016 with a GI bleed and gram-negative rod bacteremia. During that time she had an endoscopy which showed chronic gastritis. She had been treated previously with full dose anticoagulation because of paroxysmal A fib. She was discharged to [facility] on 07/16/2014. Apparently she did well initially and by January 29 she had weaned off the ventilator temporarily and was able to use a Passy-Muir valve and drink some fluids. She had been participating in physical therapy and had been up to a chair. However, on 08/03/2013 she developed the sudden onset of lower GI bleeding. Apparently she had 4 bloody stools at [facility]. In the [facility] ED she was hemodynamically stable and her hemoglobin was 7.6. She was complaining of very mild pain in her left lower quadrant.   HPI/Subjective: 2/9 A/O 4, NAD, patient able to answer questions by mouthing over event.  Assessment/Plan: LGIB -Recent EGD noted chronic gastritis - GI following - colonoscopy noted diverticula and AVMs but no active bleeding   Acute on chronic blood loss anemia  Transfused 1U PRBC  -Hemoglobin nadir of 6.5  -2/9 transfuse 1 unit PRBC- -transfuse prn Hgb <8.0   Mild hypotension -BP has improved / stabilized - follow w/ ongoing bleeding   Chronic prednisone, presumably due to COPD > Relative adrenal insufficiency -Cont to taper stress dose steroids   Chronic respiratory failure with ventilator dependence secondary to COPD -Tracheostomy placed October  2015 - -wean per respiratory  Hyponatremia  -Resolved   Hypokalemia  -Potassium goal >4  Paroxysmal A. Fib -NSR at present - follow on tele until Hgb stable   Status post Maze procedure & bovine aortic valve replacement October 2015  DM type II controlled -CBG currently well controlled   Protein-calorie malnutrition chronically fed through PEG tube     Code Status: FULL Family Communication: no family present at time of exam Disposition Plan:  Discharge in next 24-48 hours if stable    Consultants: [facility]  Procedure/Significant Events: Colo - 2/6 - moderate diverticulosis - 2 non-bleeding AVMs in the cecum     Culture  N/A  Antibiotics:  N/A  DVT prophylaxis:  SCD   Devices Chronic tracheostomy  LINES / TUBES:      Continuous Infusions: . sodium chloride 30 mL/hr at 08/07/14 1000    Objective: VITAL SIGNS: Temp: 98.3 F (36.8 C) (02/09 1200) Temp Source: Oral (02/09 1200) BP: 138/61 mmHg (02/09 0832) Pulse Rate: 70 (02/09 1053) SPO2; FIO2: 30% Vent mode; SIMV VT set; Set rate; 12 BPM Pressure support; 12cmH2O PEEP; 5cmH2O   Intake/Output Summary (Last 24 hours) at 08/07/14 1351 Last data filed at 08/07/14 1000  Gross per 24 hour  Intake   1980 ml  Output    850 ml  Net   1130 ml     Exam: General:  A/O 4, NAD, (on vent)No acute respiratory distress Lungs: Clear to auscultation bilaterally without wheezes or crackles Cardiovascular: Regular rate and rhythm without murmur gallop or rub normal S1 and S2 Abdomen: Nontender, nondistended, soft, bowel sounds positive, no rebound, no ascites, no  appreciable mass Extremities: No significant cyanosis, clubbing, or edema bilateral lower extremities  Data Reviewed: Basic Metabolic Panel:  Recent Labs Lab 08/03/14 1609 08/04/14 0306 08/05/14 0417 08/06/14 0415 08/07/14 0056  NA 127* 132* 136 135 135  K 4.7 4.1 3.2* 3.4* 3.6  CL 91* 98 104 104 104  CO2 GLUCOSE 110* 122* 113* 156* 159*  BUN 28* 27* CREATININE 0.76 0.77 0.77 0.79 0.79  CALCIUM 8.9 8.7 8.7 8.7 8.7  MG  --  2.2  --  2.0  --   PHOS  --  4.5  --   --   --    Liver Function Tests:  Recent Labs Lab 08/05/14 0417 08/06/14 0415  AST 21 25  ALT 27 24  ALKPHOS 79 73  BILITOT 0.6 0.4  PROT 4.8* 4.7*  ALBUMIN 2.4* 2.2*   No results for input(s): LIPASE, AMYLASE in the last 168 hours. No results for input(s): AMMONIA in the last 168 hours. CBC:  Recent Labs Lab 08/03/14 0550  08/04/14 1850 08/05/14 0417 08/05/14 1825 08/06/14 0415 08/07/14 0056  WBC 11.9*  < > 13.2* 11.3* 9.5 10.0 11.5*  NEUTROABS 10.2*  --   --   --   --   --   --   HGB 7.2*  < > 8.3* 7.3* 7.5* 7.1* 7.6*  HCT 22.8*  < > 24.4* 21.9* 22.8* 22.2* 23.7*  MCV 91.6  < > 86.5 89.4 91.9 92.9 93.3  PLT 212  < > 184 191 181 173 173  < > = values in this interval not displayed. Cardiac Enzymes: No results for input(s): CKTOTAL, CKMB, CKMBINDEX, TROPONINI in the last 168 hours. BNP (last 3 results) No results for input(s): BNP in the last 8760 hours.  ProBNP (last 3 results) No results for input(s): PROBNP in the last 8760 hours.  CBG:  Recent Labs Lab 08/05/14 1641 08/05/14 2034 08/05/14 2358 08/06/14 0407 08/07/14 1221  GLUCAP 189* 145* 123* 162* 160*    Recent Results (from the past 240 hour(s))  Culture, blood (routine x 2)     Status: None (Preliminary result)   Collection Time: 08/03/14  1:00 PM  Result Value Ref Range Status   Specimen Description BLOOD LEFT ANTECUBITAL  Final   Special Requests BOTTLES DRAWN AEROBIC AND ANAEROBIC 10CC  Final   Culture   Final           BLOOD CULTURE RECEIVED NO GROWTH TO DATE CULTURE WILL BE HELD FOR 5 DAYS BEFORE ISSUING A FINAL NEGATIVE REPORT Performed at [facility]    Report Status PENDING  Incomplete  Culture, blood (routine x 2)     Status: None (Preliminary result)   Collection Time: 08/03/14  1:10 PM  Result  Value Ref Range Status   Specimen Description BLOOD LEFT HAND  Final   Special Requests BOTTLES DRAWN AEROBIC ONLY 7CC  Final   Culture   Final           BLOOD CULTURE RECEIVED NO GROWTH TO DATE CULTURE WILL BE HELD FOR 5 DAYS BEFORE ISSUING A FINAL NEGATIVE REPORT Performed at [facility]    Report Status PENDING  Incomplete  Clostridium Difficile by PCR     Status: None   Collection Time: 08/04/14  1:03 AM  Result Value Ref Range Status   C difficile by pcr NEGATIVE NEGATIVE Final  Culture, respiratory (NON-Expectorated)     Status: None (Preliminary result)  Collection Time: 08/05/14  4:49 PM  Result Value Ref Range Status   Specimen Description TRACHEAL ASPIRATE  Final   Special Requests Immunocompromised  Final   Gram Stain   Final    ABUNDANT WBC PRESENT, PREDOMINANTLY PMN RARE SQUAMOUS EPITHELIAL CELLS PRESENT ABUNDANT GRAM POSITIVE RODS FEW GRAM NEGATIVE RODS RARE GRAM POSITIVE COCCI    Culture   Final    Culture reincubated for better growth [facility]    Report Status PENDING  Incomplete     Studies:  Recent x-ray studies have been reviewed in detail by the Attending Physician  Scheduled Meds:  Scheduled Meds: . ALPRAZolam  0.25 mg Per Tube BID  . amiodarone  200 mg Per Tube BID  . antiseptic oral rinse  15 mL Mouth Rinse QID  . atorvastatin  40 mg Per Tube Daily  . budesonide  0.5 mg Nebulization BID  . carvedilol  3.125 mg Per Tube BID WC  . chlorhexidine  15 mL Mouth/Throat BID  . cholecalciferol  2,000 Units Oral Daily  . feeding supplement (OSMOLITE 1.2 CAL)  237 mL Per Tube QID  . feeding supplement (PRO-STAT SUGAR FREE 64)  30 mL Per Tube q morning - 10a  . insulin aspart  0-15 Units Subcutaneous 6 times per day  . ipratropium-albuterol  3 mL Nebulization QID  . levothyroxine  200 mcg Per Tube QAC breakfast  . pantoprazole sodium  40 mg Per Tube BID  . predniSONE  10 mg Per Tube BID  . sertraline  100 mg Per Tube  Daily    Time spent on care of this patient: 40 mins   Drema DallasWOODS, CURTIS, J , [facility] CenterAC  [facility] Office  [phone] Pager - [phone]  On-Call/Text Page:      Loretha [email or web address]      password [email or web address]  If 7PM-7AM, please contact night-coverage [email or web address] Password [email or web address] 08/07/2014, 1:51 PM   LOS: 4 days   Care during the described time interval was provided by me .  I have reviewed this patient's available data, including medical history, events of note, physical examination, radiology studies and test results as part of my evaluation  Carolyne Littlesurtis Woods, MD [phone] Pager

## 2014-08-07 NOTE — Progress Notes (Signed)
**Note De-Identified Aralynn Brake Obfuscation** PMSV note:  Patient tolerated inline PMSV for 15 min with deflated cuff.  SIMV/PS rr 12, Vt 800cc to maintain PIP, 0 PEEP, PS 12, and I time of 1.00.  Patient tolerated well.  After trial patient was placed back on previous vent settings and cuff inflated to MOV.

## 2014-08-07 NOTE — Evaluation (Signed)
Passy-Muir Speaking Valve - Evaluation Patient Details  Name: Samantha Hubbard MRN: [id number] Date of Birth: 05-06-1935  Today's Date: 08/07/2014 Time: 1020-1050 SLP Time Calculation (min) (ACUTE ONLY): 30 min  Past Medical History:  Past Medical History  Diagnosis Date  . Acute respiratory failure with hypoxia and hypercapnia   . Ventilator dependent   . Healthcare-associated pneumonia   . Pneumonia, Klebsiella   . Left ventricular diastolic dysfunction   . Chronic pain syndrome   . Critical illness myopathy   . Severe protein-calorie malnutrition   . Dysphagia   . COPD (chronic obstructive pulmonary disease)     emphysema.   . Anemia of chronic disease     acute blood loss anemia 06/2014, received 2 units PRBCs  . Thyroid disease     Hypothyroidism  . Depression with anxiety   . Hypertension   . Thrush   . Hypernatremia   . Paroxysmal a-fib 10/15    status post Maze   . Aortic stenosis 10/15    status post AV replacement   . Acute renal failure (ARF) 03/2014    ARF reuiring hemodialysis post AVR  . Hyperglycemia   . Peripheral vascular disease   . Chronic low back pain   . Hyperlipemia   . Allergic rhinitis    Past Surgical History:  Past Surgical History  Procedure Laterality Date  . Esophagogastroduodenoscopy N/A 07/10/2014    Procedure: ESOPHAGOGASTRODUODENOSCOPY (EGD);  Surgeon: Inda Castle, MD;  Location: [place];  Service: Endoscopy;  Laterality: N/A;  . Aortic valve replacement  03/2014    at [place].   . Maze  03/2014    [place] for critical AS  . Peg placement  04/2014    [place]  . Esophagogastroduodenoscopy  05/2014    for GIB. clipped ulcer laying beneath peg bumper. at [place]  . Tracheostomy  04/2014    at [place].   . Colonoscopy N/A 08/04/2014    Procedure: COLONOSCOPY;  Surgeon: Juanita Craver, MD;  Location: [facility] ENDOSCOPY;  Service: Endoscopy;  Laterality: N/A;   HPI:  80 year old woman with history of ventilator dependent  respiratory failure with trach, dysphagia s/p PEG, diastolic CHF, COPD, chronic anemia, hypothyroidism, HTN, PAF s/p MAZE, aortic stenosis s/p aortic valve replacement, CKD, presenting [facility] to [facility] ED with GI bleed.  Pt had been using PMV and beginning to take PO diet at [facility].  Currently stable on chronic vent settings at this time - attempt to wean 2/7 morning was met rapidly w/ tachypnea to 50bpm - weaning efforts aborted.  SLP referral received for inline PMV.      Assessment / Plan / Recommendation Clinical Impression  Inline PMV evaluation completed with RT present for ventilator modifications.  Patency of upper airway assessed - pt met assessment criteria for valve placement (40-50% loss of exhaled Vt, significant drop in peak pressure after cuff was deflated.)  Minimal secretions produced.  Valve placed - vent settings altered by RT to accommodate for upper airway leak (PEEP 0, Vt 800, SIMV/PS rr 12).  Pt able to achieve low-pitched. low-volume phonation with mod verbal cues provided for pacing speech with exhalation.  Pt reported discomfort with flow of air through pharynx, describing burning vs cold sensation associated with air flow.  Her RR hovered mid-upper 20s; oxygenation remained above 95%.  After 15 minute trial, and when pt began to express more discomfort, valve was removed.  RT restored prior vent settings; cuff reinflated.  Recommend continued trials of in-line PMV, currently  with SLP and RT only.  When pt is comfortable wearing valve for longer periods of time, recommend FEES to determine if she may begin POs.     SLP Assessment  Patient needs continued Speech Language Pathology Services     RECS: Inline PMV use with SLP and RT only  Frequency and Duration min 3x week  2 weeks       SLP Goals Potential to Achieve Goals (ACUTE ONLY): Good   PMSV Trial  PMSV was placed for: 15 Able to redirect subglottic air through upper airway: Yes Able to Attain Phonation:  Yes Voice Quality: Hoarse;Low vocal intensity Able to Expectorate Secretions: Yes Level of Secretion Expectoration with PMSV: Oral Breath Support for Phonation: Moderately decreased Intelligibility: Intelligible Respirations During Trial: 29 SpO2 During Trial: 98 % Behavior: Alert;Controlled          Vent Dependency  Vent Mode: SIMV;PSV Set Rate: 12 bmp PEEP: 5 cmH20 Pressure Support: 12 cmH20 FiO2 (%): 30 % Vt Set: 500 mL    Cuff Deflation Trial Tolerated Cuff Deflation: Yes Length of Time for Cuff Deflation Trial: 15 Behavior: Alert;Controlled   [name] 08/07/2014, 4:40 PM  Estill Bamberg L. Tivis Ringer, [place] CCC/SLP Pager [phone]

## 2014-08-07 NOTE — Progress Notes (Addendum)
CSW (Clinical Child psychotherapistocial Worker) left voicemail for [facility] representative to notify of potential for discharge today. Awaiting return phone call to confirm pt has bed available at facility and can dc back today.  ADDENDUM: CSW received call back from [place] who confirmed pt has a bed is ready for dc today. Requesting that CSW fax dc summary to [phone]. Paged MD to notify of bed availability.  ADDENDUM 2:45pm: CSW notified that plan will be for dc tomorrow. CSW spoke with [place] with [facility] and notified. She confirmed pt will have bed available tomorrow.  Davell Beckstead, LCSWA [phone]

## 2014-08-08 LAB — MAGNESIUM: Magnesium: 1.9 mg/dL (ref 1.5–2.5)

## 2014-08-08 LAB — COMPREHENSIVE METABOLIC PANEL
ALK PHOS: 166 U/L — AB (ref 39–117)
ALT: 61 U/L — AB (ref 0–35)
AST: 47 U/L — ABNORMAL HIGH (ref 0–37)
Albumin: 2.5 g/dL — ABNORMAL LOW (ref 3.5–5.2)
Anion gap: 5 (ref 5–15)
BILIRUBIN TOTAL: 1.1 mg/dL (ref 0.3–1.2)
BUN: 21 mg/dL (ref 6–23)
CO2: 27 mmol/L (ref 19–32)
Calcium: 9 mg/dL (ref 8.4–10.5)
Chloride: 104 mmol/L (ref 96–112)
Creatinine, Ser: 0.73 mg/dL (ref 0.50–1.10)
GFR, EST NON AFRICAN AMERICAN: 80 mL/min — AB (ref >90)
Glucose, Bld: 89 mg/dL (ref 70–99)
Potassium: 3.3 mmol/L — ABNORMAL LOW (ref 3.5–5.1)
Sodium: 136 mmol/L (ref 135–145)
TOTAL PROTEIN: 5 g/dL — AB (ref 6.0–8.3)

## 2014-08-08 LAB — TYPE AND SCREEN
ABO/RH(D): O POS
ANTIBODY SCREEN: NEGATIVE
Unit division: 0

## 2014-08-08 LAB — CBC
HCT: 30.9 % — ABNORMAL LOW (ref 36.0–46.0)
Hemoglobin: 10 g/dL — ABNORMAL LOW (ref 12.0–15.0)
MCH: 29.3 pg (ref 26.0–34.0)
MCHC: 32.4 g/dL (ref 30.0–36.0)
MCV: 90.6 fL (ref 78.0–100.0)
Platelets: 200 10*3/uL (ref 150–400)
RBC: 3.41 MIL/uL — ABNORMAL LOW (ref 3.87–5.11)
RDW: 17.3 % — ABNORMAL HIGH (ref 11.5–15.5)
WBC: 16.4 10*3/uL — ABNORMAL HIGH (ref 4.0–10.5)

## 2014-08-08 LAB — GLUCOSE, CAPILLARY
GLUCOSE-CAPILLARY: 137 mg/dL — AB (ref 70–99)
Glucose-Capillary: 99 mg/dL (ref 70–99)
Glucose-Capillary: 137 mg/dL — ABNORMAL HIGH (ref 70–99)
Glucose-Capillary: 181 mg/dL — ABNORMAL HIGH (ref 70–99)
Glucose-Capillary: 140 mg/dL — ABNORMAL HIGH (ref 70–99)
Glucose-Capillary: 156 mg/dL — ABNORMAL HIGH (ref 70–99)
Glucose-Capillary: 83 mg/dL (ref 70–99)

## 2014-08-08 MED ORDER — PREDNISONE 10 MG PO TABS: 10.0000 mg | ORAL_TABLET | Freq: Every day | ORAL | Status: AC

## 2014-08-08 MED ORDER — PREDNISONE 10 MG PO TABS
10.0000 mg | ORAL_TABLET | Freq: Every day | ORAL | Status: DC
  Filled 2014-08-08: qty 1

## 2014-08-08 MED ORDER — PRO-STAT SUGAR FREE PO LIQD: 30.0000 mL | Freq: Every morning | ORAL | Status: AC

## 2014-08-08 MED ORDER — OSMOLITE 1.2 CAL PO LIQD
237.0000 mL | Freq: Four times a day (QID) | ORAL | Status: AC
Start: 2014-08-08 — End: ?

## 2014-08-08 MED ORDER — INSULIN ASPART 100 UNIT/ML ~~LOC~~ SOLN: 0.0000 [IU] | SUBCUTANEOUS | Status: AC

## 2014-08-08 MED ORDER — POTASSIUM CHLORIDE 20 MEQ/15ML (10%) PO SOLN
40.0000 meq | Freq: Once | ORAL | Status: AC
Start: 2014-08-08 — End: 2014-08-08
  Administered 2014-08-08: 40 meq
  Filled 2014-08-08: qty 30

## 2014-08-08 NOTE — Progress Notes (Signed)
CSW (Clinical Child psychotherapistocial Worker) prepared pt dc packet and placed with shadow chart. Pt discharging to [facility] Room 307 phone number [phone]. [phone]. CSW assisted with transfer/transport paperwork and provided to pt nurse. Pt nurse to get MD signatures and call [facility] [phone] to schedule pickup. Pt, pt family and facility informed. CSW signing off.  Martita Brumm, LCSWA [phone]

## 2014-08-08 NOTE — Progress Notes (Signed)
Speech Language Pathology Treatment: Samantha Hubbard Speaking valve  Patient Details Name: Samantha Hubbard MRN: [id number] DOB: 19-Aug-1934 Today's Date: 08/08/2014 Time: 1400-1450 SLP Time Calculation (min) (ACUTE ONLY): 50 min  Assessment / Plan / Recommendation Clinical Impression  Inline PMV treatment session completed in conjunction with RT for ventilator modifications. Patency of upper airway assessed - pt met assessment criteria for valve placement (40-50% loss of exhaled Vt, significant drop in peak pressure after cuff was deflated.) Valve placed and vent settings altered by RT to accommodate for upper airway leak (PEEP 0, Vt 800, SIMV/PS rr 12). Pt produced better quality phonation with improved volume and fully intelligible speech with min cues for pacing/volume coordination.  Comfort level improved today from yesterday.  RR hovered mid-upper 20s; oxygenation remained above 95%. Phonation deteriorated as session progressed, with increased breathiness and reported fatigue. Valve removed after 20-minute trial.   RT restored prior vent settings; cuff reinflated. Recommend continued trials of in-line PMV, currently with SLP and RT only. When pt is comfortable wearing valve for longer periods of time, recommend FEES to determine if she may begin POs.     HPI HPI: 80 year old woman with history of ventilator dependent respiratory failure with trach, dysphagia s/p PEG, diastolic CHF, COPD, chronic anemia, hypothyroidism, HTN, PAF s/p MAZE, aortic stenosis s/p aortic valve replacement, CKD, presenting from [facility] ED with GI bleed.  Pt had been using PMV and beginning to take PO diet at [facility].  Currently stable on chronic vent settings at this time - attempt to wean 2/7 morning was met rapidly w/ tachypnea to 50bpm - weaning efforts aborted.  SLP referral received for inline PMV.      Pertinent Vitals Pain Assessment: No/denies pain  SLP Plan  Continue with current plan of care     Recommendations        Patient may use Passy-Muir Speech Valve:  (with SLP/RT only)       Follow up Recommendations: LTACH Plan: Continue with current plan of care   Samantha Hubbard L. Samantha Hubbard, [place] CCC/SLP Pager [phone]      Samantha Hubbard 08/08/2014, 5:40 PM

## 2014-08-08 NOTE — Progress Notes (Signed)
PMSV was performed inline with Speech Therapy. Vent settings were adjusted as previous note since pt tolerated speaking valve best with those settings. RT then returned pt to original settings and inflated cuff.

## 2014-08-08 NOTE — Discharge Summary (Signed)
DISCHARGE SUMMARY  Samantha Hubbard  MR#: [id number]  DOB:[birth year]  Date of Admission: 08/03/2014 Date of Discharge: 08/08/2014  Attending Physician:Niyam Bisping T  Patient's WGN:FAOZHYQ, MCKAY, MD  Consults:  [facility]  Disposition: d/c to [facility]  Follow-up Appts:     Follow-up Information    Follow up with Your ongiong care will be provided by the attending MD at your [facility].Marland Kitchen      Tests Needing Follow-up: -sequential compression devices or other similar mechanical means for DVT prophylaxis are HIGHLY RECOMMENDED  -patient needs continued Speech Language Pathology Services -inline PMV use with SLP and RT onlyfor a min 3x week  -recheck of CBC is suggested within 3-5 days of admission, along w/ monitoring stools for evidence of bleeding  -ongoing routine PEG tube care -ongoing routine trach care continued efforts at weaning from vent -consider starting low does ASA 5 days post d/c if no recurrent bleeding  -consider decreasing dose of prednisone to 26m per day on 08/14/14 -recheck of LFTs should be accomplished in 3 days   Discharge Diagnoses: LGIB Acute on chronic blood loss anemia  Mild hypotension Chronic prednisone, presumably due to COPD > Relative adrenal insufficiency Chronic respiratory failure with ventilator dependence secondary to COPD Hyponatremia  Hypokalemia  Paroxysmal A. Fib Status post Maze procedure & bovine aortic valve replacement October 2015 DM Protein-calorie malnutrition  Initial presentation: 80year old female who underwent a maze procedure and aortic valve replacement in the fall of 2015 and had postoperative course complicated by ventilator-dependent respiratory failure [facility]. She had been hospitalized in December 2015 through January 2016 with a GI bleed and gram-negative rod bacteremia. During that time she had an endoscopy which showed  chronic gastritis. She had been treated previously with full dose anticoagulation because of paroxysmal A fib. She was discharged to [facility] on 07/16/2014. Apparently she did well initially and by January 29 she had weaned off the ventilator temporarily and was able to use a Passy-Muir valve and drink some fluids. She had been participating in physical therapy and had been up to a chair. However on 08/03/2013 she developed the sudden onset of lower GI bleeding. Apparently she had 4 bloody stools at [facility]. [facility] hem[facility] and her hemoglobin was 7.6. She was complaining of very mild pain in her left lower quadrant.   Hospital Course:  LGIB Recent EGD noted chronic gastritis - GI followed in consultation - colonoscopy this admit noted diverticula and AVMs but no active bleeding - followed stools and Hgb - was on ASA at admit but this was held th/o admit - if Hgb remains stable consider resuming low dose ASA in 5 days - stools are tan and normal consistency at time of d/c   Acute on chronic blood loss anemia  Transfused 2U PRBC this admit - Hgb nadir of 6.5 - Hgb stabilized at ~7.5 - second unit was given on 2/9 as an "insurance policy" to assure she does not acutely decompensate hemodynamically should she bleed again   Mild hypotension BP has improved / stabilized   Chronic prednisone, presumably due to COPD > Relative adrenal insufficiency Cont to taper stress dose steroids - d/c on prednisone 111mper day - after 5 days of this, consider decrease to 64m59mer day   Chronic respiratory failure with ventilator dependence secondary to COPD Tracheostomy placed October 2015 - stable on chronic vent settings at this  time - attempt to wean 2/7 morning was met rapidly w/ tachypnea to 50bpm - weaning efforts aborted - pt stable and comfortable on vent support - SLP eval for swallowing purposes   Hyponatremia  ?simply due to volume loss - improved/normalized w/ volume  resuscitation  Hypokalemia  Replace and follow intermittently - Mg normal   Paroxysmal A. Fib NSR at time of d/c   Status post Maze procedure & bovine aortic valve replacement October 2015  DM CBG well controlled th/o hospital stay   Protein-calorie malnutrition chronically fed through PEG tube - SLP to eval swallow as she makes improvement w/ PMV  Mild transaminitis On the day of d/c the patient's LFTs were mildly elevated - this should be checked in f/u in 3 days     Medication List    STOP taking these medications        aspirin EC 81 MG tablet     ciprofloxacin 400 MG/200ML Soln  Commonly known as:  CIPRO     enoxaparin 40 MG/0.4ML injection  Commonly known as:  LOVENOX     NOVOLOG FLEXPEN 100 UNIT/ML FlexPen  Generic drug:  insulin aspart  Replaced by:  insulin aspart 100 UNIT/ML injection     PROTEIN PO      TAKE these medications        ALPRAZolam 0.25 MG tablet  Commonly known as:  XANAX  0.25 mg by PEG Tube route 2 (two) times daily.     amiodarone 200 MG tablet  Commonly known as:  PACERONE  200 mg by PEG Tube route 2 (two) times daily.     atorvastatin 40 MG tablet  Commonly known as:  LIPITOR  40 mg by PEG Tube route daily.     budesonide 0.5 MG/2ML nebulizer solution  Commonly known as:  PULMICORT  0.5 mg by Tracheal Tube route every 12 (twelve) hours.     carvedilol 3.125 MG tablet  Commonly known as:  COREG  3.125 mg by PEG Tube route 2 (two) times daily with a meal.     chlorhexidine 0.12 % solution  Commonly known as:  PERIDEX  Use as directed 15 mLs in the mouth or throat 2 (two) times daily.     CULTURELLE PO  1 capsule by PEG Tube route 2 (two) times daily.     feeding supplement (OSMOLITE 1.2 CAL) Liqd  Place 237 mLs into feeding tube 4 (four) times daily.     feeding supplement (PRO-STAT SUGAR FREE 64) Liqd  Place 30 mLs into feeding tube every morning.     free water Soln  Place 300 mLs into feeding tube every 4 (four)  hours.     guaifenesin 400 MG Tabs tablet  Commonly known as:  HUMIBID E  400 mg by PEG Tube route 2 (two) times daily.     insulin aspart 100 UNIT/ML injection  Commonly known as:  novoLOG  Inject 0-15 Units into the skin every 4 (four) hours.     ipratropium-albuterol 0.5-2.5 (3) MG/3ML Soln  Commonly known as:  DUONEB  Take 3 mLs by nebulization every 6 (six) hours.     levothyroxine 200 MCG tablet  Commonly known as:  SYNTHROID, LEVOTHROID  200 mcg by PEG Tube route daily before breakfast.     MILK OF MAGNESIA CONCENTRATE PO  30 mLs by PEG Tube route daily as needed (constipation).     ondansetron 4 MG tablet  Commonly known as:  ZOFRAN  4 mg by PEG  Tube route every 6 (six) hours as needed for nausea or vomiting.     oxyCODONE 5 MG immediate release tablet  Commonly known as:  Oxy IR/ROXICODONE  5 mg by PEG Tube route every 6 (six) hours as needed for moderate pain or severe pain.     pantoprazole sodium 40 mg/20 mL Pack  Commonly known as:  PROTONIX  Place 20 mLs (40 mg total) into feeding tube 2 (two) times daily.     predniSONE 10 MG tablet  Commonly known as:  DELTASONE  Place 1 tablet (10 mg total) into feeding tube daily with breakfast.  Start taking on:  08/09/2014     sertraline 100 MG tablet  Commonly known as:  ZOLOFT  100 mg by PEG Tube route daily.     traZODone 50 MG tablet  Commonly known as:  DESYREL  Take 50 mg by mouth at bedtime as needed for sleep.     Vitamin D3 2000 UNITS Tabs  2,000 Units by PEG Tube route daily.       Day of Discharge BP 162/65 mmHg  Pulse 78  Temp(Src) 98.2 F (36.8 C) (Oral)  Resp 29  Ht _0  (1.575 m)  Wt 67 kg (147 lb 11.3 oz)  BMI 27.01 kg/m2  SpO2 100%  Physical Exam: General: No acute respiratory distress - trach insertion site unremarkable w/o d/c or bleeding  Lungs: Clear to auscultation bilaterally without wheezes or crackles Cardiovascular: Regular rate and rhythm without murmur gallop or rub  normal S1 and S2 Abdomen: Nontender, nondistended, soft, bowel sounds positive, no rebound, no ascites, no appreciable mass - PEG insertion clean and dry  Extremities: No significant cyanosis, clubbing, or edema bilateral lower extremities  Basic Metabolic Panel:  Recent Labs Lab 08/04/14 0306 08/05/14 0417 08/06/14 0415 08/07/14 0056 08/08/14 0220  NA 132* 136 135 135 136  K 4.1 3.2* 3.4* 3.6 3.3*  CL 98 104 104 104 104  CO2 _1 GLUCOSE 122* 113* 156* 159* 89  BUN 27* _2 CREATININE 0.77 0.77 0.79 0.79 0.73  CALCIUM 8.7 8.7 8.7 8.7 9.0  MG 2.2  --  2.0  --  1.9  PHOS 4.5  --   --   --   --     Liver Function Tests:  Recent Labs Lab 08/05/14 0417 08/06/14 0415 08/08/14 0220  AST 21 25 47*  ALT 27 24 61*  ALKPHOS 79 73 166*  BILITOT 0.6 0.4 1.1  PROT 4.8* 4.7* 5.0*  ALBUMIN 2.4* 2.2* 2.5*   Coags:  Recent Labs Lab 08/03/14 0550  INR 1.06   CBC:  Recent Labs Lab 08/03/14 0550  08/05/14 0417 08/05/14 1825 08/06/14 0415 08/07/14 0056 08/08/14 0220  WBC 11.9*  < > 11.3* 9.5 10.0 11.5* 16.4*  NEUTROABS 10.2*  --   --   --   --   --   --   HGB 7.2*  < > 7.3* 7.5* 7.1* 7.6* 10.0*  HCT 22.8*  < > 21.9* 22.8* 22.2* 23.7* 30.9*  MCV 91.6  < > 89.4 91.9 92.9 93.3 90.6  PLT 212  < > 191 181 173 173 200  < > = values in this interval not displayed.  CBG:  Recent Labs Lab 08/07/14 1622 08/07/14 1947 08/07/14 2333 08/08/14 0327 08/08/14 0824  GLUCAP 142* 231* 121* 99 137*    Recent Results (from the past 240 hour(s))  Culture, blood (routine x 2)  Status: None (Preliminary result)   Collection Time: 08/03/14  1:00 PM  Result Value Ref Range Status   Specimen Description BLOOD LEFT ANTECUBITAL  Final   Special Requests BOTTLES DRAWN AEROBIC AND ANAEROBIC 10CC  Final   Culture   Final           BLOOD CULTURE RECEIVED NO GROWTH TO DATE CULTURE WILL BE HELD FOR 5 DAYS BEFORE ISSUING A FINAL NEGATIVE REPORT Performed at [facility]    Report Status PENDING  Incomplete  Culture, blood (routine x 2)     Status: None (Preliminary result)   Collection Time: 08/03/14  1:10 PM  Result Value Ref Range Status   Specimen Description BLOOD LEFT HAND  Final   Special Requests BOTTLES DRAWN AEROBIC ONLY [place]  Final   Culture   Final           BLOOD CULTURE RECEIVED NO GROWTH TO DATE CULTURE WILL BE HELD FOR 5 DAYS BEFORE ISSUING A FINAL NEGATIVE REPORT Performed at [facility]    Report Status PENDING  Incomplete  Clostridium Difficile by PCR     Status: None   Collection Time: 08/04/14  1:03 AM  Result Value Ref Range Status   C difficile by pcr NEGATIVE NEGATIVE Final  Culture, respiratory (NON-Expectorated)     Status: None (Preliminary result)   Collection Time: 08/05/14  4:49 PM  Result Value Ref Range Status   Specimen Description TRACHEAL ASPIRATE  Final   Special Requests Immunocompromised  Final   Gram Stain   Final    ABUNDANT WBC PRESENT, PREDOMINANTLY PMN RARE SQUAMOUS EPITHELIAL CELLS PRESENT ABUNDANT GRAM POSITIVE RODS FEW GRAM NEGATIVE RODS RARE GRAM POSITIVE COCCI    Culture   Final    ABUNDANT GRAM NEGATIVE RODS Performed at [facility]    Report Status PENDING  Incomplete      Time spent in discharge (includes decision making & examination of pt): >35 minutes  08/08/2014, 11:57 AM   Cherene Altes, MD [facility] Office  [phone] Pager [phone]  On-Call/Text Page:      [email or web address]      password [facility]

## 2014-08-08 NOTE — Progress Notes (Signed)
Report called to [facility].  Pt stable, vital signs WDL.  Family aware of plan for pt to return to [facility] today.

## 2014-08-09 LAB — CULTURE, BLOOD (ROUTINE X 2)
CULTURE: NO GROWTH
Culture: NO GROWTH

## 2014-08-10 LAB — CULTURE, RESPIRATORY W GRAM STAIN

## 2014-08-10 LAB — CULTURE, RESPIRATORY

## 2014-09-02 ENCOUNTER — Emergency Department (HOSPITAL_COMMUNITY)
Admission: EM | Admit: 2014-09-02 | Discharge: 2014-09-28 | Disposition: E | Payer: Medicare Other | Attending: Emergency Medicine | Admitting: Emergency Medicine

## 2014-09-02 ENCOUNTER — Encounter (HOSPITAL_COMMUNITY): Payer: Self-pay | Admitting: Physical Medicine and Rehabilitation

## 2014-09-02 DIAGNOSIS — F419 Anxiety disorder, unspecified: Secondary | ICD-10-CM | POA: Diagnosis not present

## 2014-09-02 DIAGNOSIS — Z8701 Personal history of pneumonia (recurrent): Secondary | ICD-10-CM | POA: Diagnosis not present

## 2014-09-02 DIAGNOSIS — E780 Pure hypercholesterolemia: Secondary | ICD-10-CM | POA: Insufficient documentation

## 2014-09-02 DIAGNOSIS — I10 Essential (primary) hypertension: Secondary | ICD-10-CM | POA: Diagnosis not present

## 2014-09-02 DIAGNOSIS — Z79899 Other long term (current) drug therapy: Secondary | ICD-10-CM | POA: Diagnosis not present

## 2014-09-02 DIAGNOSIS — Z862 Personal history of diseases of the blood and blood-forming organs and certain disorders involving the immune mechanism: Secondary | ICD-10-CM | POA: Diagnosis not present

## 2014-09-02 DIAGNOSIS — G894 Chronic pain syndrome: Secondary | ICD-10-CM | POA: Insufficient documentation

## 2014-09-02 DIAGNOSIS — E079 Disorder of thyroid, unspecified: Secondary | ICD-10-CM | POA: Insufficient documentation

## 2014-09-02 DIAGNOSIS — I469 Cardiac arrest, cause unspecified: Secondary | ICD-10-CM | POA: Diagnosis present

## 2014-09-02 DIAGNOSIS — Z8619 Personal history of other infectious and parasitic diseases: Secondary | ICD-10-CM | POA: Diagnosis not present

## 2014-09-02 DIAGNOSIS — Z7952 Long term (current) use of systemic steroids: Secondary | ICD-10-CM | POA: Insufficient documentation

## 2014-09-02 DIAGNOSIS — Z87891 Personal history of nicotine dependence: Secondary | ICD-10-CM | POA: Diagnosis not present

## 2014-09-02 DIAGNOSIS — J449 Chronic obstructive pulmonary disease, unspecified: Secondary | ICD-10-CM | POA: Insufficient documentation

## 2014-09-02 MED ORDER — EPINEPHRINE HCL 0.1 MG/ML IJ SOSY
PREFILLED_SYRINGE | INTRAMUSCULAR | Status: AC | PRN
  Administered 2014-09-02: 1 via INTRAVENOUS

## 2014-09-28 NOTE — Code Documentation (Signed)
CPR stopped, pulse check performed, unable to palpate pulses, asystole on cardiac monitor. CPR continued.

## 2014-09-28 NOTE — ED Notes (Signed)
Chaplain paged to assist son to the RM

## 2014-09-28 NOTE — ED Notes (Signed)
Family at bedside. 

## 2014-09-28 NOTE — ED Notes (Addendum)
[facility], per facility staff patient became bradycardic and hypotensive. Upon [facility] arrival, PEA on monitor, CPR started, able to obtain pulses briefly after x7 epinephrine, pulses lost shortly after, CPR in progress upon arrival to ED.

## 2014-09-28 NOTE — Progress Notes (Signed)
Chaplain notified that pt's son Mr. Gertie GowdaHartmann was in the waiting area.   Chaplain brought Mr. Gertie GowdaHartmann to consult room A awaiting to speak with doctor.   During our conversation, Mr. Gertie GowdaHartmann expressed that he has been here a few times before. His mother had heart surgery recently and was on a vent. He expressed that she had further complications.   Pt has daughter in [place] who has been planning to visit her.   Mr. Gertie GowdaHartmann now in consult A waiting to get update from doctor.   Gala RomneyBrown, Zamia Tyminski J, Chaplain 09/17/2014

## 2014-09-28 NOTE — Progress Notes (Signed)
Chaplain aided son to C26 to be with pt.   Ursula BeathJohn Soucy, pt's son contact information in chart.   [facility] [address], [place], [place] [phone]  Chaplain provided emotional and spiritual support.   Walked with son to the [employer] entrance as he is going home to be with his wife.   No other visitors coming, Ms. Gertie GowdaHartmann can be released to the morgue.   Gala RomneyBrown, Jashay Roddy J, Chaplain 08/29/2014 1:52 PM

## 2014-09-28 NOTE — ED Provider Notes (Signed)
CSN: [id number]     Arrival date & time Sep 20, 2014  1150 History   First MD Initiated Contact with Patient 2014-09-20 1201     Chief Complaint  Patient presents with  . Cardiac Arrest   Level V caveat secondary to cardiac arrest and urgency of intervention  (Consider location/radiation/quality/duration/timing/severity/associated sxs/prior Treatment) HPI 80 year old female presents via EMS from [facility] with reports of outpatient cardiac arrest. EMS reports they were called and arrived to the facility at 1119. At that time the patient was in PE a. She has received prolonged CPR. She received 7 rounds of prehospital epinephrine. She had some out-of-hospital episode of return of circulation but then it deteriorated to asystole. CPR continued entire time prehospital. On arrival here. Pupils are fixed and dilated, Samuel Bouche is in place and left IO in left tib-fib present. Patient has tracheostomy and is being bagged to this. Past Medical History  Diagnosis Date  . Acute respiratory failure with hypoxia and hypercapnia   . Ventilator dependent   . Healthcare-associated pneumonia   . Pneumonia, Klebsiella   . Left ventricular diastolic dysfunction   . Chronic pain syndrome   . Critical illness myopathy   . Severe protein-calorie malnutrition   . Dysphagia   . COPD (chronic obstructive pulmonary disease)     emphysema.   . Anemia of chronic disease     acute blood loss anemia 06/2014, received 2 units PRBCs  . Thyroid disease     Hypothyroidism  . Depression with anxiety   . Hypertension   . Thrush   . Hypernatremia   . Paroxysmal a-fib 10/15    status post Maze   . Aortic stenosis 10/15    status post AV replacement   . Acute renal failure (ARF) 03/2014    ARF reuiring hemodialysis post AVR  . Hyperglycemia   . Peripheral vascular disease   . Chronic low back pain   . Hyperlipemia   . Allergic rhinitis    Past Surgical History  Procedure Laterality Date  .  Esophagogastroduodenoscopy N/A 07/10/2014    Procedure: ESOPHAGOGASTRODUODENOSCOPY (EGD);  Surgeon: Louis Meckel, MD;  Location: [facility] ENDOSCOPY;  Service: Endoscopy;  Laterality: N/A;  . Aortic valve replacement  03/2014    at [facility].   . Maze  03/2014    [facility] for critical AS  . Peg placement  04/2014    [facility]  . Esophagogastroduodenoscopy  05/2014    for GIB. clipped ulcer laying beneath peg bumper. at [place]  . Tracheostomy  04/2014    at [place].   . Colonoscopy N/A 08/04/2014    Procedure: COLONOSCOPY;  Surgeon: Charna Elizabeth, MD;  Location[facility] ENDOSCOPY;  Service: Endoscopy;  Laterality: N/A;   No family history on file. History  Substance Use Topics  . Smoking status: Former Smoker -- 1.00 packs/day for 50 years    Types: Cigarettes  . Smokeless tobacco: Never Used  . Alcohol Use: No   OB History    No data available     Review of Systems  Unable to perform ROS     Allergies  Zyrtec; Augmentin; Bactrim; Neo-synephrine ; Neo-synephrine 12 hour spray; and Sudafed  Home Medications   Prior to Admission medications   Medication Sig Start Date End Date Taking? Authorizing Provider  ALPRAZolam (XANAX) 0.25 MG tablet 0.25 mg by PEG Tube route 2 (two) times daily.    Historical Provider, MD  Amino Acids-Protein Hydrolys (FEEDING SUPPLEMENT, PRO-STAT SUGAR FREE 64,) LIQD Place 30 mLs into  feeding tube every morning. 08/08/14   Lonia Blood, MD  amiodarone (PACERONE) 200 MG tablet 200 mg by PEG Tube route 2 (two) times daily.    Historical Provider, MD  atorvastatin (LIPITOR) 40 MG tablet 40 mg by PEG Tube route daily.    Historical Provider, MD  budesonide (PULMICORT) 0.5 MG/2ML nebulizer solution 0.5 mg by Tracheal Tube route every 12 (twelve) hours.    Historical Provider, MD  carvedilol (COREG) 3.125 MG tablet 3.125 mg by PEG Tube route 2 (two) times daily with a meal.    Historical Provider, MD  chlorhexidine (PERIDEX) 0.12 % solution Use as directed 15 mLs  in the mouth or throat 2 (two) times daily.    Historical Provider, MD  Cholecalciferol (VITAMIN D3) 2000 UNITS TABS 2,000 Units by PEG Tube route daily.    Historical Provider, MD  guaifenesin (HUMIBID E) 400 MG TABS tablet 400 mg by PEG Tube route 2 (two) times daily.    Historical Provider, MD  insulin aspart (NOVOLOG) 100 UNIT/ML injection Inject 0-15 Units into the skin every 4 (four) hours. 08/08/14   Lonia Blood, MD  ipratropium-albuterol (DUONEB) 0.5-2.5 (3) MG/3ML SOLN Take 3 mLs by nebulization every 6 (six) hours.    Historical Provider, MD  Lactobacillus Rhamnosus, GG, (CULTURELLE PO) 1 capsule by PEG Tube route 2 (two) times daily.    Historical Provider, MD  levothyroxine (SYNTHROID, LEVOTHROID) 200 MCG tablet 200 mcg by PEG Tube route daily before breakfast.     Historical Provider, MD  Magnesium Hydroxide (MILK OF MAGNESIA CONCENTRATE PO) 30 mLs by PEG Tube route daily as needed (constipation).    Historical Provider, MD  Nutritional Supplements (FEEDING SUPPLEMENT, OSMOLITE 1.2 CAL,) LIQD Place 237 mLs into feeding tube 4 (four) times daily. 08/08/14   Lonia Blood, MD  ondansetron (ZOFRAN) 4 MG tablet 4 mg by PEG Tube route every 6 (six) hours as needed for nausea or vomiting.    Historical Provider, MD  oxyCODONE (OXY IR/ROXICODONE) 5 MG immediate release tablet 5 mg by PEG Tube route every 6 (six) hours as needed for moderate pain or severe pain.    Historical Provider, MD  pantoprazole sodium (PROTONIX) 40 mg/20 mL PACK Place 20 mLs (40 mg total) into feeding tube 2 (two) times daily. 07/16/14   Duayne Cal, NP  predniSONE (DELTASONE) 10 MG tablet Place 1 tablet (10 mg total) into feeding tube daily with breakfast. 08/09/14   Lonia Blood, MD  sertraline (ZOLOFT) 100 MG tablet 100 mg by PEG Tube route daily.    Historical Provider, MD  traZODone (DESYREL) 50 MG tablet Take 50 mg by mouth at bedtime as needed for sleep.    Historical Provider, MD  Water For  Irrigation, Sterile (FREE WATER) SOLN Place 300 mLs into feeding tube every 4 (four) hours. 07/16/14   Duayne Cal, NP   Pulse 0 Physical Exam  Constitutional: She appears well-developed and well-nourished.  HENT:  Head: Atraumatic.  Eyes:  Pupils fixed  Cardiovascular:  Lucas thumper in place No heart sounds auscultated  Pulmonary/Chest:  Bilateral breath sounds present with assistant  Abdominal: Soft.  Musculoskeletal: She exhibits no edema.  Neurological:  Unresponsive  Skin:  Skin cool    ED Course  Procedures (including critical care time) Labs Review Labs Reviewed - No data to display  Imaging Review No results found.   EKG Interpretation None      MDM   Final diagnoses:  Cardiac arrest  Repeat epinephrine given while transferring to ED stretcher. After epinephrine given and circulated compression stopped and no pulses are palpable. CPR was discontinued at 1156. I will attempt to contact the patient's primary care physician Dr. Hillary BowMcKay Crowley to send death certificate.  Discussed with Dr. Angelina Okrowley and she will sign death certificate.   Hilario Quarryanielle S Aubrianne Molyneux, MD 2015/01/13 1224

## 2014-09-28 NOTE — Code Documentation (Signed)
CPR stopped, pulse check performed, unable to palpate pulses, asystole on cardiac monitor. Time of death called @ 11:56 by Dr. Rosalia Hammersay.

## 2014-09-28 DEATH — deceased

## 2016-02-13 IMAGING — CR DG CHEST 1V PORT
1 series · 1 of 1 positions shown · non-contrast
Comparison: None.

CLINICAL DATA: Acute onset of shortness of breath and weakness.
Initial encounter.

EXAM:
PORTABLE CHEST - 1 VIEW

[AP]
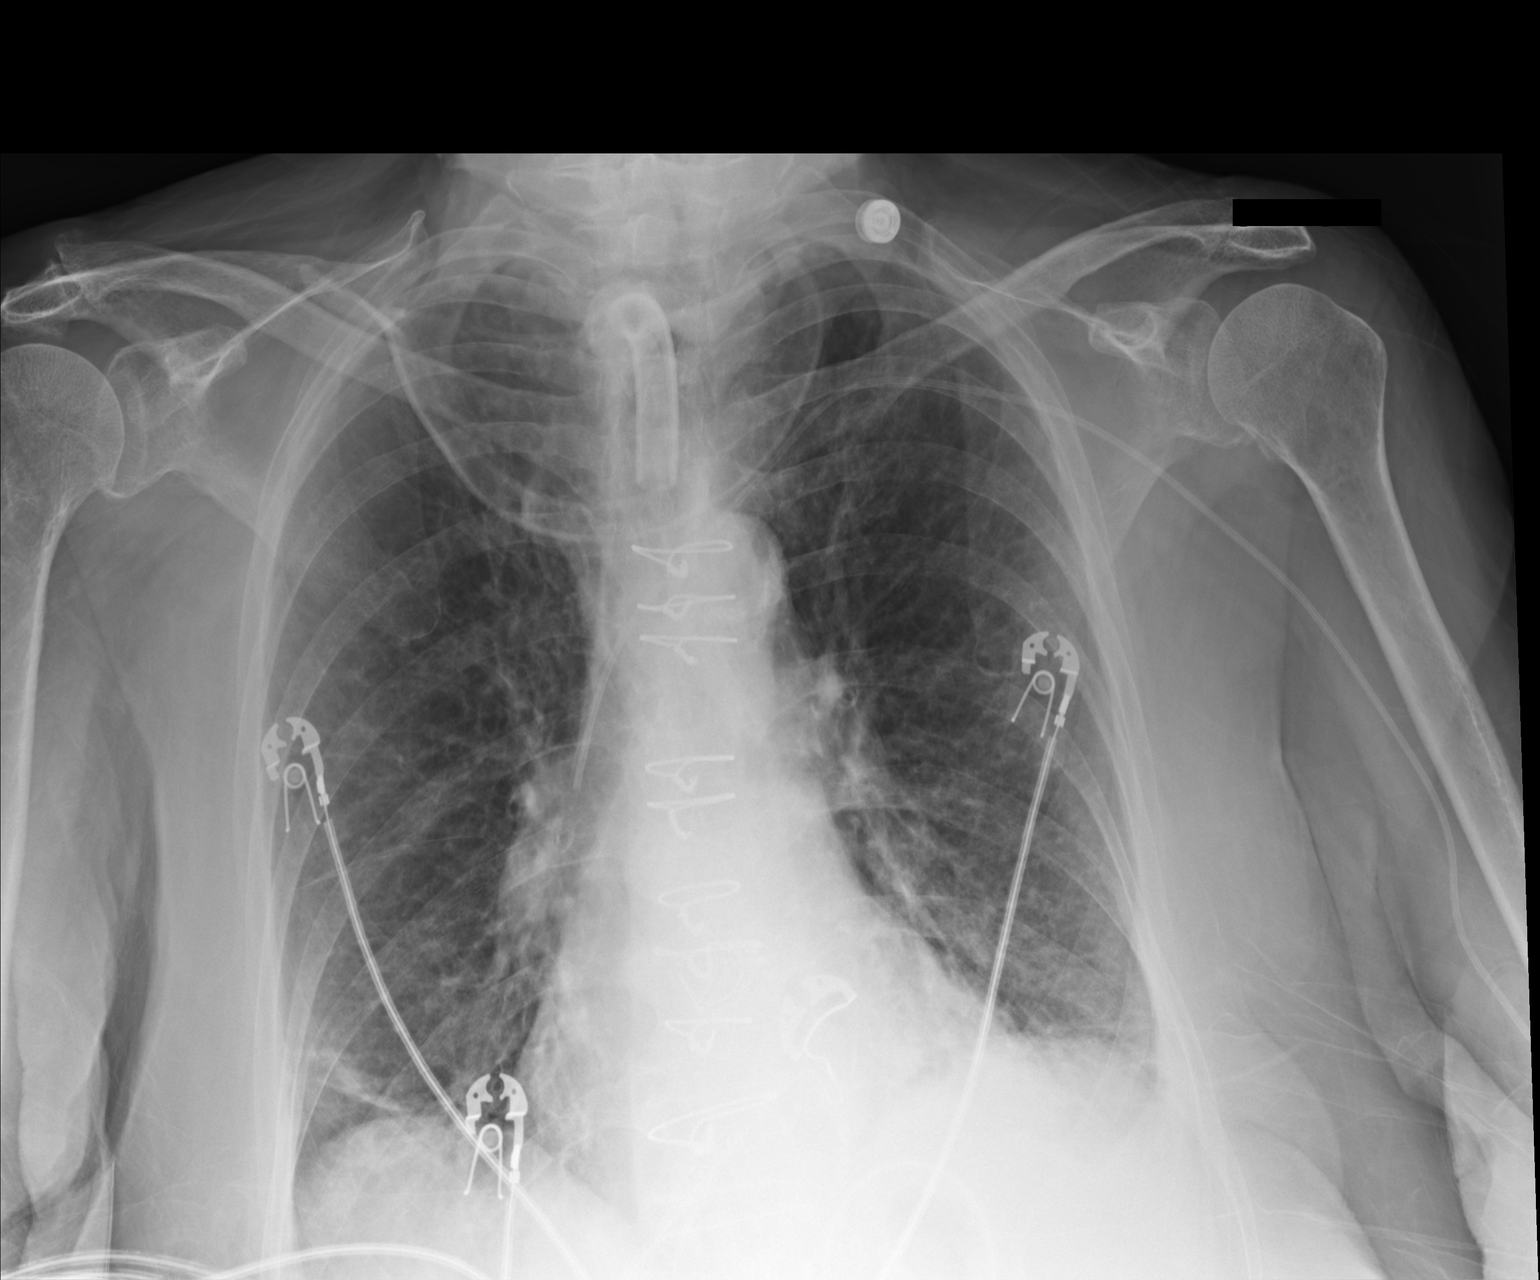

[1 of 1 positions shown; findings below may reference images not displayed]

FINDINGS: The patient's tracheostomy tube is seen ending 5-6 cm above the
carina. A left PICC is noted ending about the mid SVC.

Minimal bibasilar opacities likely reflect atelectasis, though mild
pneumonia might have a similar appearance. Pulmonary vascularity is
at the upper limits of normal. No pleural effusion or pneumothorax
is seen.

The cardiomediastinal silhouette is borderline normal in size. The
patient is status post median sternotomy. An aortic valve
replacement is noted. No acute osseous abnormalities are identified.
IMPRESSION: Minimal bibasilar opacities likely reflect atelectasis, though mild
pneumonia might have a similar appearance.

## 2016-02-13 IMAGING — CR DG CHEST 1V PORT
1 series · 1 of 1 positions shown · non-contrast
Comparison: 07/10/2014 at 2786 hr

CLINICAL DATA: Respiratory failure, tracheostomy

EXAM:
PORTABLE CHEST - 1 VIEW

[AP]
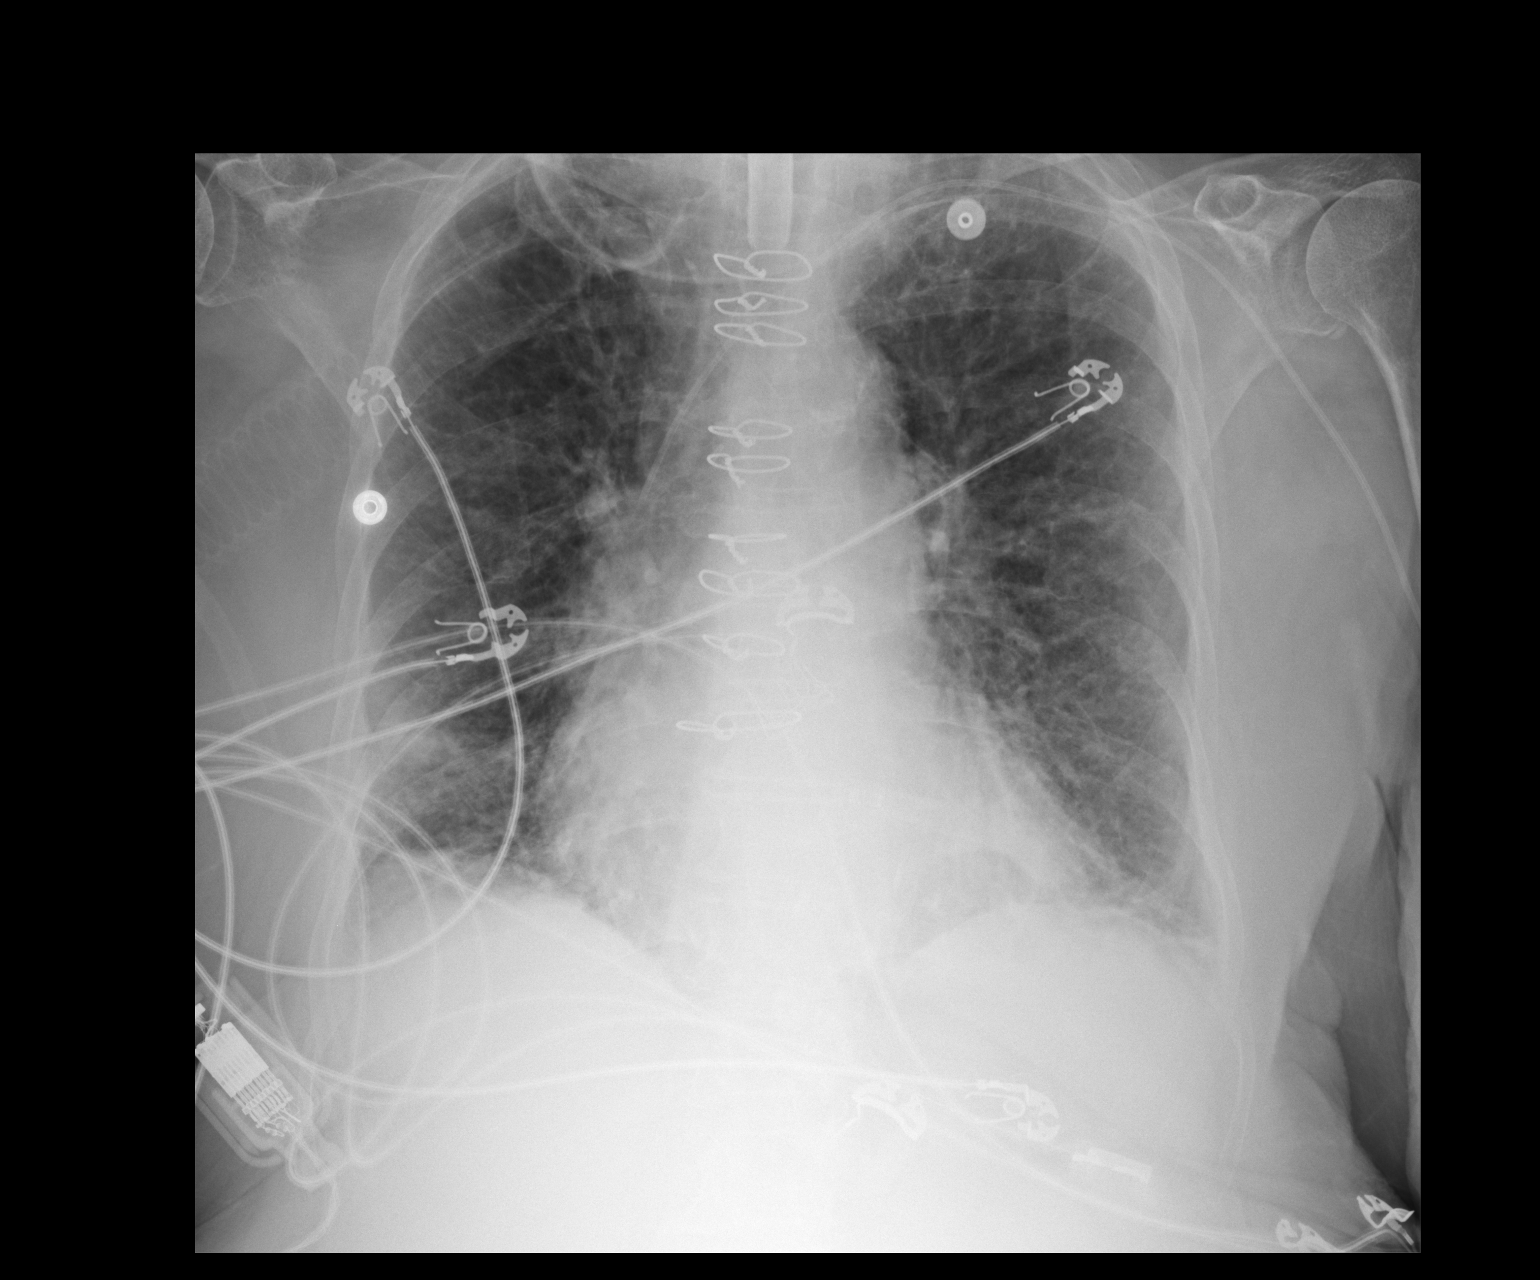

[1 of 1 positions shown; findings below may reference images not displayed]

FINDINGS: Mild patchy bilateral lower lobe opacities, likely atelectasis,
pneumonia not entirely excluded. Underlying chronic interstitial
markings/emphysematous changes. No pleural effusion or pneumothorax.

The heart is mildly enlarged.  Prosthetic aortic valve.

Left arm PICC terminates in the mid SVC.

Tracheostomy in satisfactory position.
IMPRESSION: Mild patchy bilateral lower lobe opacities, likely atelectasis.
